# Patient Record
Sex: Male | Born: 1985 | Race: White | Hispanic: No | Marital: Single | Smoking: Current some day smoker
Health system: Southern US, Community
[De-identification: ages and names within clinical notes are randomized; demographics above are authoritative.]

## PROBLEM LIST (undated history)

## (undated) DIAGNOSIS — J069 Acute upper respiratory infection, unspecified: Secondary | ICD-10-CM

## (undated) DIAGNOSIS — Z87442 Personal history of urinary calculi: Secondary | ICD-10-CM

## (undated) DIAGNOSIS — J189 Pneumonia, unspecified organism: Secondary | ICD-10-CM

## (undated) DIAGNOSIS — J939 Pneumothorax, unspecified: Secondary | ICD-10-CM

## (undated) HISTORY — PX: CHEST TUBE INSERTION: SHX231

## (undated) HISTORY — PX: RHINOPLASTY: SUR1284

---

## 2013-09-20 ENCOUNTER — Emergency Department: Payer: Self-pay | Admitting: Internal Medicine

## 2015-04-28 ENCOUNTER — Emergency Department
Admission: EM | Admit: 2015-04-28 | Discharge: 2015-04-28 | Disposition: A | Discharge status: Home / Self Care | Payer: Self-pay | Attending: Emergency Medicine | Admitting: Emergency Medicine

## 2015-04-28 ENCOUNTER — Emergency Department: Payer: Self-pay

## 2015-04-28 ENCOUNTER — Encounter: Payer: Self-pay | Admitting: Emergency Medicine

## 2015-04-28 DIAGNOSIS — B349 Viral infection, unspecified: Secondary | ICD-10-CM | POA: Insufficient documentation

## 2015-04-28 DIAGNOSIS — R590 Localized enlarged lymph nodes: Secondary | ICD-10-CM | POA: Insufficient documentation

## 2015-04-28 HISTORY — DX: Pneumothorax, unspecified: J93.9

## 2015-04-28 HISTORY — DX: Acute upper respiratory infection, unspecified: J06.9

## 2015-04-28 HISTORY — DX: Pneumonia, unspecified organism: J18.9

## 2015-04-28 MED ORDER — OXYMETAZOLINE HCL 0.05 % NA SOLN
1.0000 | Freq: Once | NASAL | Status: AC
  Administered 2015-04-28: 1 via NASAL

## 2015-04-28 MED ORDER — AZITHROMYCIN 250 MG PO TABS: ORAL_TABLET | ORAL | Status: DC

## 2015-04-28 MED ORDER — PREDNISONE 10 MG PO TABS: 10.0000 mg | ORAL_TABLET | Freq: Every day | ORAL | Status: DC

## 2015-04-28 MED ORDER — OXYMETAZOLINE HCL 0.05 % NA SOLN
NASAL | Status: DC
Start: 2015-04-28 — End: 2015-04-29
  Filled 2015-04-28: qty 15

## 2015-04-28 NOTE — ED Notes (Signed)
Pt arrived to the ED for complaints of fever, cough, congestion and headache for the last 4 days. Pt reports taking over the counter medication without relive and states that he has a hx of bad URI. Pt is AOx4 in no apparent distress.

## 2015-04-28 NOTE — Discharge Instructions (Signed)
Viral Infections A viral infection can be caused by different types of viruses.Most viral infections are not serious and resolve on their own. However, some infections may cause severe symptoms and may lead to further complications. SYMPTOMS Viruses can frequently cause:  Minor sore throat.  Aches and pains.  Headaches.  Runny nose.  Different types of rashes.  Watery eyes.  Tiredness.  Cough.  Loss of appetite.  Gastrointestinal infections, resulting in nausea, vomiting, and diarrhea. These symptoms do not respond to antibiotics because the infection is not caused by bacteria. However, you might catch a bacterial infection following the viral infection. This is sometimes called a "superinfection." Symptoms of such a bacterial infection may include:  Worsening sore throat with pus and difficulty swallowing.  Swollen neck glands.  Chills and a high or persistent fever.  Severe headache.  Tenderness over the sinuses.  Persistent overall ill feeling (malaise), muscle aches, and tiredness (fatigue).  Persistent cough.  Yellow, green, or brown mucus production with coughing. HOME CARE INSTRUCTIONS   Only take over-the-counter or prescription medicines for pain, discomfort, diarrhea, or fever as directed by your caregiver.  Drink enough water and fluids to keep your urine clear or pale yellow. Sports drinks can provide valuable electrolytes, sugars, and hydration.  Get plenty of rest and maintain proper nutrition. Soups and broths with crackers or rice are fine. SEEK IMMEDIATE MEDICAL CARE IF:   You have severe headaches, shortness of breath, chest pain, neck pain, or an unusual rash.  You have uncontrolled vomiting, diarrhea, or you are unable to keep down fluids.  You or your child has an oral temperature above 102 F (38.9 C), not controlled by medicine.  Your baby is older than 3 months with a rectal temperature of 102 F (38.9 C) or higher.  Your baby is 853  months old or younger with a rectal temperature of 100.4 F (38 C) or higher. MAKE SURE YOU:   Understand these instructions.  Will watch your condition.  Will get help right away if you are not doing well or get worse.   This information is not intended to replace advice given to you by your health care provider. Make sure you discuss any questions you have with your health care provider.   Document Released: 02/02/2005 Document Revised: 07/18/2011 Document Reviewed: 10/01/2014 Elsevier Interactive Patient Education 2016 Elsevier Inc.  Antibiotic Resistance Antibiotics are medicines used to treat infections caused by bacteria. Antibiotic resistance means the medicine no longer works against the bacteria. If this happens, the bacteria can continue to grow and cause infection. CAUSES  The most common cause of antibiotic resistance is the repeated use of antibiotic medicines. This is especially true when the medicine is not necessary. Antibiotics only work against bacterial infections. When antibiotics are given in response to illnesses caused by viruses, like colds or the flu, many normal bacteria in the body are killed. Some bacteria that are not killed may develop resistance to the antibiotic. These bacteria may grow and cause infections that are resistant to some antibiotics. Other causes of antibiotic resistance may include:  Food sources exposed to antibiotics, such as:  Meat.  Produce grown near livestock treated with antibiotics.  Close contact with someone who has an antibiotic-resistant infection. RISK FACTORS You may be at higher risk for antibiotic resistance if:  You are repeatedly given antibiotics to treat viral infections.  You do not take your medicine as prescribed, such as not finishing all of the medicine.  You need to  take antibiotics often because of a long-term medical condition.  You take medicines that weaken your immune system.  You have  surgery.  You are elderly.  You need dialysis.  You have an organ transplant.  You are being treated for cancer.  You have a type of infection that is more likely to be caused by resistant bacteria. These include certain:  Skin infections.  Sexually transmitted diseases.  Respiratory infections.  Infections of the lining of the brain and spinal cord (meningitis).  You consume foods from animals treated with antibiotics. Antibiotic-resistant bacteria can be passed through the food.  You live with or care for someone with an antibiotic-resistant infection. SIGNS AND SYMPTOMS The main sign of antibiotic resistance is having an infection that does not improve with treatment. The specific signs and symptoms you have will depend on the type of infection present. DIAGNOSIS  Your health care provider may suspect antibiotic resistance if your condition does not improve after you have been treated for an infection. You may have tests done, including:  Collection of a fluid sample. This is done to identify the bacteria under a microscope and determine what type of antibiotic will work against it (culture and sensitivity).  Other blood tests and imaging tests. These are done to check if your infection has spread or has become more serious. TREATMENT  Treatment for antibiotic resistance depends on whether you have an active infection and how severe the infection is. If you have an active infection:  Your health care provider may change your medicine to an antibiotic that kills more types of bacteria (broad spectrum).  Serious antibiotic-resistant infections may need to be treated in the hospital. In some cases, you may need to have the infection drained surgically. You may also need to take medicines through an IV tube. HOME CARE INSTRUCTIONS  Take medicines only as directed by your health care provider.  Take your antibiotic medicine as directed by your health care provider. Finish the  antibiotic even if you start to feel better. Make sure you take the correct dose at the scheduled time.  Do not save any of the antibiotics for the next time you get sick.  Do not take an antibiotic that is prescribed for someone else.  Do not take an antibiotic for a viral infection.  Wash your hands often with soap and water.  Keep your vaccinations current, as directed by your health care provider. SEEK MEDICAL CARE IF:  You have a fever or chills.  You are taking a new antibiotic and you are not getting better after a few days.  You develop new symptoms of infection.  You have three or more periods of diarrhea after starting a new antibiotic.  You think you are having a reaction to the antibiotic medicine, such as developing a rash. SEEK IMMEDIATE MEDICAL CARE IF:  You develop a rash, and you also have:  Itching of your tongue or mouth.  A tight feeling in your throat.  Difficulty breathing.  Chest pain or tightness.  Dizziness or fainting.   This information is not intended to replace advice given to you by your health care provider. Make sure you discuss any questions you have with your health care provider.   Document Released: 07/16/2002 Document Revised: 05/16/2014 Document Reviewed: 01/22/2014 Elsevier Interactive Patient Education [employer]

## 2015-04-28 NOTE — ED Provider Notes (Signed)
CSN: [id number]     Arrival date & time 04/28/15  1939 History   First MD Initiated Contact with Patient 04/28/15 2050     Chief Complaint  Patient presents with  . Nasal Congestion  . Cough  . Headache  . Fever     (Consider location/radiation/quality/duration/timing/severity/associated sxs/prior Treatment) HPI  29 year old male presents to the emergency department for evaluation of sinus pain and pressure congestion and cough. Symptoms been present for 4 days. He has had mild low-grade fevers off and on control with Tylenol. He describes a lot of nasal congestion and pressure in his ears. He denies any nausea vomiting diarrhea, chest pain, shortness of breath, abdominal pain.  Past Medical History  Diagnosis Date  . Pneumothorax   . URI (upper respiratory infection)   . Pneumonia    Past Surgical History  Procedure Laterality Date  . Chest tube insertion    . Rhinoplasty     History reviewed. No pertinent family history. Social History  Substance Use Topics  . Smoking status: Never Smoker   . Smokeless tobacco: None  . Alcohol Use: No    Review of Systems  Constitutional: Positive for fever. Negative for chills, activity change and appetite change.  HENT: Positive for congestion, ear pain, postnasal drip and sinus pressure. Negative for mouth sores, rhinorrhea, sore throat and trouble swallowing.   Eyes: Negative for photophobia, pain and discharge.  Respiratory: Negative for cough, chest tightness and shortness of breath.   Cardiovascular: Negative for chest pain and leg swelling.  Gastrointestinal: Negative for nausea, vomiting, abdominal pain, diarrhea and abdominal distention.  Genitourinary: Negative for dysuria and difficulty urinating.  Musculoskeletal: Negative for back pain, arthralgias and gait problem.  Skin: Negative for color change and rash.  Neurological: Negative for dizziness and headaches.  Hematological: Negative for adenopathy.   Psychiatric/Behavioral: Negative for behavioral problems and agitation.      Allergies  Review of patient's allergies indicates no known allergies.  Home Medications   Prior to Admission medications   Medication Sig Start Date End Date Taking? Authorizing Provider  azithromycin (ZITHROMAX Z-PAK) 250 MG tablet Take 2 tablets (500 mg) on  Day 1,  followed by 1 tablet (250 mg) once daily on Days 2 through 5. 04/28/15   Evon Slackhomas C Debi Cousin, PA-C  predniSONE (DELTASONE) 10 MG tablet Take 1 tablet (10 mg total) by mouth daily. 6,5,4,3,2,1 six day taper 04/28/15   Evon Slackhomas C Daymon Hora, PA-C   BP 151/89 mmHg  Pulse 95  Temp(Src) 98.1 F (36.7 C) (Oral)  Resp 18  Ht 5\' 9"  (1.753 m)  Wt 59.421 kg  BMI 19.34 kg/m2  SpO2 97% Physical Exam  Constitutional: He is oriented to person, place, and time. He appears well-developed and well-nourished.  HENT:  Head: Normocephalic and atraumatic.  Right Ear: External ear normal.  Left Ear: External ear normal.  Nose: Nose normal.  Mouth/Throat: Oropharynx is clear and moist. No oropharyngeal exudate.  Mild mucosal edema. No sinus tenderness to palpation percussion  Eyes: Conjunctivae and EOM are normal. Pupils are equal, round, and reactive to light.  Neck: Normal range of motion. Neck supple.  Cardiovascular: Normal rate, regular rhythm, normal heart sounds and intact distal pulses.   Pulmonary/Chest: Effort normal and breath sounds normal. No respiratory distress. He has no wheezes. He has no rales. He exhibits no tenderness.  Abdominal: Soft. Bowel sounds are normal. He exhibits no distension and no mass. There is no tenderness. There is no rebound and no guarding.  Musculoskeletal: Normal range of motion. He exhibits no edema or tenderness.  Lymphadenopathy:    He has cervical adenopathy (positive posterior cervical).  Neurological: He is alert and oriented to person, place, and time.  Skin: Skin is warm and dry.  Psychiatric: He has a normal mood  and affect. His behavior is normal. Judgment and thought content normal.    ED Course  Procedures (including critical care time) Labs Review Labs Reviewed - No data to display  Imaging Review Dg Chest 2 View  04/28/2015  CLINICAL DATA:  Fever and cough for 4 days EXAM: CHEST - 2 VIEW COMPARISON:  None. FINDINGS: The heart size and mediastinal contours are within normal limits. Both lungs are clear. The visualized skeletal structures are unremarkable. IMPRESSION: No active disease. Electronically Signed   By: Alcide Clever M.D.   On: 04/28/2015 20:33   I have personally reviewed and evaluated these images and lab results as part of my medical decision-making.   EKG Interpretation None      MDM   Final diagnoses:  Viral illness    29 year old male with viral illness. He has significant sinus nasal congestion. He is given Afrin nasal spray, this is giving him significant relief. He will also continue with over-the-counter cough and cold medicine with decongestant. Tylenol and ibuprofen as needed for fevers. He is given postdated prescription for azithromycin if no improvement in 6 days he can start antibiotic. Return to the ER sooner for any worsening symptoms urgent changes in his health.  Evon Slack, PA-C 04/28/15 2118  Jennye Moccasin, MD 04/28/15 [phone]

## 2015-04-28 NOTE — ED Notes (Signed)

## 2015-04-28 NOTE — ED Notes (Signed)
Pt ambulated to treatment room with steady gait. Pt present to ED with c/o productive cough and fever since 04/25/15, states had a fever of 100.5 about 45 min PTA. Also c/o nasal congestion and ear pain x 1 day. Pt reports taking Dayquil and Tylenol without relief. Pt states "I feel like I have fluid in my ear." Pt denies chest pain or shortness of breath. Pt alert and oriented x 4, no increased work in breathing noted. Pt is an everyday smoker.

## 2017-12-30 ENCOUNTER — Emergency Department
Admission: EM | Admit: 2017-12-30 | Discharge: 2017-12-30 | Disposition: A | Discharge status: Home / Self Care | Payer: Self-pay | Attending: Emergency Medicine | Admitting: Emergency Medicine

## 2017-12-30 ENCOUNTER — Other Ambulatory Visit: Payer: Self-pay

## 2017-12-30 ENCOUNTER — Emergency Department: Payer: Self-pay

## 2017-12-30 DIAGNOSIS — R001 Bradycardia, unspecified: Secondary | ICD-10-CM | POA: Insufficient documentation

## 2017-12-30 DIAGNOSIS — Z79899 Other long term (current) drug therapy: Secondary | ICD-10-CM | POA: Insufficient documentation

## 2017-12-30 DIAGNOSIS — N200 Calculus of kidney: Secondary | ICD-10-CM | POA: Insufficient documentation

## 2017-12-30 LAB — COMPREHENSIVE METABOLIC PANEL
ALK PHOS: 55 U/L (ref 38–126)
ALT: 19 U/L (ref 0–44)
AST: 28 U/L (ref 15–41)
Albumin: 4.3 g/dL (ref 3.5–5.0)
Anion gap: 12 (ref 5–15)
BILIRUBIN TOTAL: 0.8 mg/dL (ref 0.3–1.2)
BUN: 27 mg/dL — AB (ref 6–20)
CALCIUM: 9.3 mg/dL (ref 8.9–10.3)
CO2: 21 mmol/L — ABNORMAL LOW (ref 22–32)
CREATININE: 1.05 mg/dL (ref 0.61–1.24)
Chloride: 107 mmol/L (ref 98–111)
GFR calc Af Amer: >60 mL/min (ref >60)
GFR calc non Af Amer: >60 mL/min (ref >60)
Glucose, Bld: 137 mg/dL — ABNORMAL HIGH (ref 70–99)
POTASSIUM: 3.7 mmol/L (ref 3.5–5.1)
Sodium: 140 mmol/L (ref 135–145)
TOTAL PROTEIN: 7.3 g/dL (ref 6.5–8.1)

## 2017-12-30 LAB — URINALYSIS, COMPLETE (UACMP) WITH MICROSCOPIC
Bacteria, UA: NONE SEEN
Bilirubin Urine: NEGATIVE
Glucose, UA: NEGATIVE mg/dL
Hgb urine dipstick: NEGATIVE
Ketones, ur: 20 mg/dL — AB
Leukocytes, UA: NEGATIVE
Nitrite: NEGATIVE
PH: 8 (ref 5.0–8.0)
Protein, ur: NEGATIVE mg/dL
SPECIFIC GRAVITY, URINE: 1.016 (ref 1.005–1.030)

## 2017-12-30 LAB — CBC
HEMATOCRIT: 41 % (ref 40.0–52.0)
HEMOGLOBIN: 14.4 g/dL (ref 13.0–18.0)
MCH: 32.2 pg (ref 26.0–34.0)
MCHC: 35 g/dL (ref 32.0–36.0)
MCV: 92.1 fL (ref 80.0–100.0)
Platelets: 145 10*3/uL — ABNORMAL LOW (ref 150–440)
RBC: 4.45 MIL/uL (ref 4.40–5.90)
RDW: 13.5 % (ref 11.5–14.5)
WBC: 15.1 10*3/uL — AB (ref 3.8–10.6)

## 2017-12-30 LAB — LIPASE, BLOOD: LIPASE: 71 U/L — AB (ref 11–51)

## 2017-12-30 MED ORDER — MORPHINE SULFATE (PF) 4 MG/ML IV SOLN
4.0000 mg | Freq: Once | INTRAVENOUS | Status: AC
  Administered 2017-12-30: 4 mg via INTRAVENOUS

## 2017-12-30 MED ORDER — SODIUM CHLORIDE 0.9 % IV BOLUS
1000.0000 mL | Freq: Once | INTRAVENOUS | Status: AC
  Administered 2017-12-30: 1000 mL via INTRAVENOUS

## 2017-12-30 MED ORDER — HYDROMORPHONE HCL 1 MG/ML IJ SOLN
1.0000 mg | Freq: Once | INTRAMUSCULAR | Status: AC
  Administered 2017-12-30: 1 mg via INTRAVENOUS

## 2017-12-30 MED ORDER — MORPHINE SULFATE (PF) 4 MG/ML IV SOLN
4.0000 mg | Freq: Once | INTRAVENOUS | Status: AC
  Administered 2017-12-30: 4 mg via INTRAVENOUS
  Filled 2017-12-30: qty 1

## 2017-12-30 MED ORDER — TAMSULOSIN HCL 0.4 MG PO CAPS: 0.4000 mg | ORAL_CAPSULE | Freq: Every day | ORAL | 0 refills | Status: DC

## 2017-12-30 MED ORDER — ONDANSETRON HCL 4 MG/2ML IJ SOLN
4.0000 mg | Freq: Once | INTRAMUSCULAR | Status: AC
  Administered 2017-12-30: 4 mg via INTRAVENOUS

## 2017-12-30 MED ORDER — TRAMADOL HCL 50 MG PO TABS: 50.0000 mg | ORAL_TABLET | Freq: Four times a day (QID) | ORAL | 0 refills | Status: AC | PRN

## 2017-12-30 MED ORDER — ONDANSETRON HCL 4 MG/2ML IJ SOLN
INTRAMUSCULAR | Status: AC
  Filled 2017-12-30: qty 2

## 2017-12-30 MED ORDER — KETOROLAC TROMETHAMINE 30 MG/ML IJ SOLN
INTRAMUSCULAR | Status: AC
  Administered 2017-12-30: 30 mg via INTRAVENOUS
  Filled 2017-12-30: qty 1

## 2017-12-30 MED ORDER — NALOXONE HCL 2 MG/2ML IJ SOSY
1.0000 mg | PREFILLED_SYRINGE | Freq: Once | INTRAMUSCULAR | Status: AC
  Administered 2017-12-30: 1 mg via INTRAVENOUS

## 2017-12-30 MED ORDER — MORPHINE SULFATE (PF) 4 MG/ML IV SOLN
INTRAVENOUS | Status: AC
  Filled 2017-12-30: qty 1

## 2017-12-30 MED ORDER — NALOXONE HCL 2 MG/2ML IJ SOSY
PREFILLED_SYRINGE | INTRAMUSCULAR | Status: AC
  Administered 2017-12-30: 1 mg via INTRAVENOUS
  Filled 2017-12-30: qty 2

## 2017-12-30 MED ORDER — HYDROMORPHONE HCL 1 MG/ML IJ SOLN
INTRAMUSCULAR | Status: AC
Start: 2017-12-30 — End: 2017-12-30
  Administered 2017-12-30: 1 mg via INTRAVENOUS
  Filled 2017-12-30: qty 1

## 2017-12-30 MED ORDER — KETOROLAC TROMETHAMINE 30 MG/ML IJ SOLN
30.0000 mg | Freq: Once | INTRAMUSCULAR | Status: AC
  Administered 2017-12-30: 30 mg via INTRAVENOUS

## 2017-12-30 NOTE — ED Notes (Signed)
Patient transported to CT 

## 2017-12-30 NOTE — ED Provider Notes (Signed)
-----------------------------------------   9:28 AM on 12/30/2017 -----------------------------------------  Signed out to me with a kidney stone, medically cleared pending urinalysis for discharge, paperwork is done.  Patient has no complaints at this time, feels much better.  It was however noted that he was having asymptomatic bradycardia.  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  Heart rate is at this time 3367, patient has an RSR prime configuration on EKG with normal axis, nonspecific ST changes, he has had sinus arrhythmia noted.  I discussed with Dr. Lady Garyfath, he and I discussed the patient's findings chief complaint, vital signs and labs etc., he feels the patient is safe for close outpatient follow-up and we will arrange that the referral.  Return precautions were given understood.  Patient has no chest pain no shortness of breath etc.   Jeanmarie PlantMcShane, Xia Stohr A, MD 12/30/17 [phone]

## 2017-12-30 NOTE — ED Notes (Signed)
Note from VernalJenna RN at [phone] is error in charting and was charted on the wrong patient.

## 2017-12-30 NOTE — ED Notes (Addendum)
Pt resting quietly in room, heart rate fluctuates from 38-45 while asleep and when awake goes up to 55-60.  Pt continues to be in normal sinus rhythm. Pt reports he feels weak.  Pt did receive 1L NS. Pt states he has pain when he moves around.  Pt states he does not do any physical activity on a regular basis and states he just works a lot.

## 2017-12-30 NOTE — ED Notes (Signed)
Patient awake and alert and oriented   Family at bedside

## 2017-12-30 NOTE — ED Provider Notes (Signed)
[facility] Emergency Department Provider Note    First MD Initiated Contact with Patient 12/30/17 [phone]     (approximate)  I have reviewed the triage vital signs and the nursing notes.   HISTORY  Chief Complaint Flank Pain    HPI Logan Sellers is a 32 y.o. male with below list of previous medical conditions presents to the emergency department with acute onset of 10 out of 10 left flank/groin pain 30 minutes before arrival.  Patient admits to nausea however no vomiting.  Patient also admits to diaphoresis.  Patient denies any previous history of kidney stones.  She denies any dysuria or fever  Past Medical History:  Diagnosis Date  . Pneumonia   . Pneumothorax   . URI (upper respiratory infection)     There are no active problems to display for this patient.   Past Surgical History:  Procedure Laterality Date  . CHEST TUBE INSERTION    . RHINOPLASTY      Prior to Admission medications   Medication Sig Start Date End Date Taking? Authorizing Provider  azithromycin (ZITHROMAX Z-PAK) 250 MG tablet Take 2 tablets (500 mg) on  Day 1,  followed by 1 tablet (250 mg) once daily on Days 2 through 5. 04/28/15   Evon SlackGaines, Thomas C, PA-C  predniSONE (DELTASONE) 10 MG tablet Take 1 tablet (10 mg total) by mouth daily. 6,5,4,3,2,1 six day taper 04/28/15   Evon SlackGaines, Thomas C, PA-C  tamsulosin [facility](FLOMAX) 0.4 MG CAPS capsule Take 1 capsule (0.4 mg total) by mouth daily after breakfast. 12/30/17   Darci CurrentBrown, [place] N, MD  traMADol (ULTRAM) 50 MG tablet Take 1 tablet (50 mg total) by mouth every 6 (six) hours as needed. 12/30/17 12/30/18  Darci CurrentBrown, Bartolo N, MD    Allergies No known drug allergies No family history on file.  Social History Social History   Tobacco Use  . Smoking status: Never Smoker  Substance Use Topics  . Alcohol use: No  . Drug use: No    Review of Systems Constitutional: No fever/chills Eyes: No visual changes. ENT: No sore  throat. Cardiovascular: Denies chest pain. Respiratory: Denies shortness of breath. Gastrointestinal: No abdominal pain.  No nausea, no vomiting.  No diarrhea.  No constipation. Genitourinary: Negative for dysuria. Musculoskeletal: Negative for neck pain.  Negative for back pain. Integumentary: Negative for rash. Neurological: Negative for headaches, focal weakness or numbness.   ____________________________________________   PHYSICAL EXAM:  VITAL SIGNS: ED Triage Vitals  Enc Vitals Group     BP 12/30/17 0415 (!) 162/97     Pulse Rate 12/30/17 0415 (!) 48     Resp 12/30/17 0415 18     Temp 12/30/17 0415 98.3 F (36.8 C)     Temp Source 12/30/17 0415 Oral     SpO2 12/30/17 0415 99 %     Weight 12/30/17 0403 59 kg (130 lb)     Height 12/30/17 0403 1.753 m (5\' 9" )     Head Circumference --      Peak Flow --      Pain Score 12/30/17 0403 10     Pain Loc --      Pain Edu? --      Excl. in GC? --     Constitutional: Alert and oriented.  Apparent distress Eyes: Conjunctivae are normal. Head: Atraumatic. Mouth/Throat: Mucous membranes are moist. Oropharynx non-erythematous. Neck: No stridor.  Cardiovascular: Normal rate, regular rhythm. Good peripheral circulation. Grossly normal heart sounds. Respiratory: Normal respiratory effort.  No retractions. Lungs CTAB. Gastrointestinal: Soft and nontender. No distention.  Musculoskeletal: No lower extremity tenderness nor edema. No gross deformities of extremities. Neurologic:  Normal speech and language. No gross focal neurologic deficits are appreciated.  Skin:  Skin is warm, dry and intact. No rash noted. Psychiatric: Mood and affect are normal. Speech and behavior are normal.  ____________________________________________   LABS (all labs ordered are listed, but only abnormal results are displayed)  Labs Reviewed  CBC - Abnormal; Notable for the following components:      Result Value   WBC 15.1 (*)    Platelets 145 (*)     All other components within normal limits  COMPREHENSIVE METABOLIC PANEL - Abnormal; Notable for the following components:   CO2 21 (*)    Glucose, Bld 137 (*)    BUN 27 (*)    All other components within normal limits  LIPASE, BLOOD - Abnormal; Notable for the following components:   Lipase 71 (*)    All other components within normal limits  URINALYSIS, COMPLETE (UACMP) WITH MICROSCOPIC   ________  RADIOLOGY I, [place] Mack[place]y Bolte, personally viewed and evaluated these images (plain radiographs) as part of my medical decision making, as well as reviewing the written report by the radiologist.  ED MD interpretation: 2 mm left UVJ stone  Official radiology report(s): Ct Renal Stone Study  Result Date: 12/30/2017 CLINICAL DATA:  LEFT flank pain.  Suspect kidney stones. EXAM: CT ABDOMEN AND PELVIS WITHOUT CONTRAST TECHNIQUE: Multidetector CT imaging of the abdomen and pelvis was performed following the standard protocol without IV contrast. COMPARISON:  None. FINDINGS: LOWER CHEST: Lung bases are clear. The visualized heart size is normal. No pericardial effusion. HEPATOBILIARY: Normal. PANCREAS: Normal. SPLEEN: Normal. ADRENALS/URINARY TRACT: Kidneys are orthotopic, demonstrating normal size and morphology. Mild LEFT hydroureteronephrosis to the level of the ureterovesicular junction where a 2 mm calculus is present. Punctate bilateral and 3 mm LEFT interpolar nephrolithiasis. No RIGHT nephrolithiasis or hydronephrosis limited assessment for renal masses by nonenhanced CT. Urinary bladder is partially distended and unremarkable. Normal adrenal glands. STOMACH/BOWEL: The stomach, small and large bowel are normal in course and caliber without inflammatory changes, sensitivity decreased by lack of enteric contrast. Normal appendix. VASCULAR/LYMPHATIC: Aortoiliac vessels are normal in course and caliber. No lymphadenopathy by CT size criteria. REPRODUCTIVE: Normal. OTHER: No intraperitoneal free  fluid or free air. Phleboliths RIGHT pelvis. MUSCULOSKELETAL: 11 x 7 mm soft tissue ventral epidural space at L5-S1. Mild broad dextroscoliosis. IMPRESSION: 1. 2 mm LEFT ureterovesicular junction calculus with mild residual hydroureteronephrosis. 2. Bilateral nephrolithiasis measuring to 3 mm. 3. 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 Non emergent MRI lumbar spine with better characterize finding. Electronically Signed   By: Awilda Metro M.D.   On: 12/30/2017 05:10     Procedures   ____________________________________________   INITIAL IMPRESSION / ASSESSMENT AND PLAN / ED COURSE  As part of my medical decision making, I reviewed the following data within the electronic MEDICAL RECORD NUMBER   32 year old male presenting to the emergency room with above-stated history and physical exam consistent with left Ureterolithiasis confirmed on CT Renal.  Patient given IV fluids 1 L IV morphine 4 mg however patient states that pain was not resolved and as such patient given IV Dilaudid 1 mg.  Patient also given IV Toradol 30mg .  CT scan revealed a 3 mm right UVJ stone patient patient's pain improved.  Patient did however have an episode of hypoxia following Dilaudid administration such 1 mg of was  administered with resolution of hypoxia. ____________________________________________  FINAL CLINICAL IMPRESSION(S) / ED DIAGNOSES  Final diagnoses:  Kidney stone on left side     MEDICATIONS GIVEN DURING THIS VISIT:  Medications  morphine 4 MG/ML injection 4 mg (4 mg Intravenous Given 12/30/17 0419)  ondansetron (ZOFRAN) injection 4 mg (4 mg Intravenous Given 12/30/17 0419)  sodium chloride 0.9 % bolus 1,000 mL (0 mLs Intravenous Stopped 12/30/17 0605)  morphine 4 MG/ML injection 4 mg (4 mg Intravenous Given 12/30/17 0434)  HYDROmorphone (DILAUDID) injection 1 mg (1 mg Intravenous Given 12/30/17 0500)  ketorolac (TORADOL) 30 MG/ML injection 30 mg (30 mg Intravenous Given 12/30/17 0459)  naloxone (NARCAN)  injection 1 mg (1 mg Intravenous Given 12/30/17 0525)     ED Discharge Orders         Ordered    traMADol (ULTRAM) 50 MG tablet  Every 6 hours PRN     12/30/17 0555    tamsulosin (FLOMAX) 0.4 MG CAPS capsule  Daily after breakfast     12/30/17 0555           Note:  This document was prepared using Dragon voice recognition software and may include unintentional dictation errors.    Darci Current, MD 12/30/17 [phone]

## 2017-12-30 NOTE — ED Notes (Signed)
Patient's heart rated dropped to 39. Oxygen level decreased to 88% in RA. MD informed. MD and RN to bedside.

## 2017-12-30 NOTE — ED Notes (Signed)
Stood up and voided 100 ml

## 2017-12-30 NOTE — ED Triage Notes (Signed)
Pt in with co left flank pain acute onset, pt diaphoretic in triage.

## 2017-12-30 NOTE — ED Notes (Signed)
This RN gave report to Consulting civil engineerCharge RN, [facility]

## 2019-06-15 ENCOUNTER — Encounter: Payer: Self-pay | Admitting: Emergency Medicine

## 2019-06-15 ENCOUNTER — Emergency Department
Admission: EM | Admit: 2019-06-15 | Discharge: 2019-06-16 | Disposition: A | Discharge status: Home / Self Care | Payer: Self-pay | Attending: Emergency Medicine | Admitting: Emergency Medicine

## 2019-06-15 ENCOUNTER — Emergency Department: Payer: Self-pay

## 2019-06-15 DIAGNOSIS — N452 Orchitis: Secondary | ICD-10-CM | POA: Insufficient documentation

## 2019-06-15 DIAGNOSIS — N50812 Left testicular pain: Secondary | ICD-10-CM

## 2019-06-15 DIAGNOSIS — Z79899 Other long term (current) drug therapy: Secondary | ICD-10-CM | POA: Insufficient documentation

## 2019-06-15 DIAGNOSIS — N50819 Testicular pain, unspecified: Secondary | ICD-10-CM

## 2019-06-15 DIAGNOSIS — R109 Unspecified abdominal pain: Secondary | ICD-10-CM | POA: Insufficient documentation

## 2019-06-15 DIAGNOSIS — N5089 Other specified disorders of the male genital organs: Secondary | ICD-10-CM

## 2019-06-15 HISTORY — DX: Personal history of urinary calculi: Z87.442

## 2019-06-15 LAB — BASIC METABOLIC PANEL
Anion gap: 10 (ref 5–15)
BUN: 22 mg/dL — ABNORMAL HIGH (ref 6–20)
CO2: 27 mmol/L (ref 22–32)
Calcium: 9.5 mg/dL (ref 8.9–10.3)
Chloride: 102 mmol/L (ref 98–111)
Creatinine, Ser: 1.07 mg/dL (ref 0.61–1.24)
GFR calc Af Amer: >60 mL/min (ref >60)
GFR calc non Af Amer: >60 mL/min (ref >60)
Glucose, Bld: 111 mg/dL — ABNORMAL HIGH (ref 70–99)
Potassium: 3.4 mmol/L — ABNORMAL LOW (ref 3.5–5.1)
Sodium: 139 mmol/L (ref 135–145)

## 2019-06-15 LAB — URINALYSIS, ROUTINE W REFLEX MICROSCOPIC
Bacteria, UA: NONE SEEN
Bilirubin Urine: NEGATIVE
Glucose, UA: NEGATIVE mg/dL
Hgb urine dipstick: NEGATIVE
Ketones, ur: NEGATIVE mg/dL
Nitrite: NEGATIVE
Protein, ur: NEGATIVE mg/dL
Specific Gravity, Urine: 1.014 (ref 1.005–1.030)
pH: 7 (ref 5.0–8.0)

## 2019-06-15 LAB — CBC
HCT: 40.1 % (ref 39.0–52.0)
Hemoglobin: 14.2 g/dL (ref 13.0–17.0)
MCH: 31.7 pg (ref 26.0–34.0)
MCHC: 35.4 g/dL (ref 30.0–36.0)
MCV: 89.5 fL (ref 80.0–100.0)
Platelets: 141 10*3/uL — ABNORMAL LOW (ref 150–400)
RBC: 4.48 MIL/uL (ref 4.22–5.81)
RDW: 12.5 % (ref 11.5–15.5)
WBC: 10.7 10*3/uL — ABNORMAL HIGH (ref 4.0–10.5)
nRBC: 0 % (ref 0.0–0.2)

## 2019-06-15 MED ORDER — OXYCODONE-ACETAMINOPHEN 5-325 MG PO TABS
1.0000 | ORAL_TABLET | Freq: Once | ORAL | Status: AC
  Administered 2019-06-15: 19:00:00 1 via ORAL
  Filled 2019-06-15: qty 1

## 2019-06-15 NOTE — ED Triage Notes (Signed)
Pt to ED with c/o of left testicle and left flank pain that started yesterday. Pt states hx of kidney stones.

## 2019-06-15 NOTE — ED Provider Notes (Signed)
[facility] Emergency Department Provider Note  ____________________________________________   First MD Initiated Contact with Patient 06/15/19 2259     (approximate)  I have reviewed the triage vital signs and the nursing notes.   HISTORY  Chief Complaint Abdominal Pain    HPI Logan Sellers is a 34 y.o. male whose history includes a prior episode of kidney stones about a year and a half ago.  He presents tonight for acute onset sharp pain in his left testicle but also in his left flank.  He said initially it felt like it started in the left testicle and was rating up into the left flank and then it seemed to change locations.  When he arrived in the ED the pain was severe and he could barely move or walk.  However it got a lot better after the Percocet he was given here.  He still has some residual aching pain in the left testicle and he says he feels like it is a little bit swollen at the top of the testicle.  He has had no injury recently and the most severe pain he had was actually in the left flank rather than in the testicle but he is more worried about the testicle pain.  He states that the pain he was experiencing in the left flank feels similar to that which he experienced a year or 2 ago when he had a kidney stone.  He has seen no blood in his urine.  He has no concerns for sexually transmitted disease.  He has had nausea but no vomiting.  He denies fever/chills, sore throat, chest pain, cough, shortness of breath, and any other abdominal pain.   He has not had any dysuria but reports increased urinary frequency with hesitancy.        Past Medical History:  Diagnosis Date  . History of kidney stones   . Pneumonia   . Pneumothorax   . URI (upper respiratory infection)     There are no problems to display for this patient.   Past Surgical History:  Procedure Laterality Date  . CHEST TUBE INSERTION    . RHINOPLASTY      Prior to Admission  medications   Medication Sig Start Date End Date Taking? Authorizing Provider  azithromycin (ZITHROMAX Z-PAK) 250 MG tablet Take 2 tablets (500 mg) on  Day 1,  followed by 1 tablet (250 mg) once daily on Days 2 through 5. 04/28/15   Evon Slack, PA-C  doxycycline (VIBRAMYCIN) 100 MG capsule Take 1 capsule (100 mg total) by mouth 2 (two) times daily for 14 days. 06/16/19 06/30/19  Loleta Rose, MD  HYDROcodone-acetaminophen (NORCO/VICODIN) 5-325 MG tablet Take 2 tablets by mouth every 6 (six) hours as needed for moderate pain or severe pain. 06/16/19   Loleta Rose, MD  ondansetron (ZOFRAN ODT) 4 MG disintegrating tablet Allow 1-2 tablets to dissolve in your mouth every 8 hours as needed for nausea/vomiting 06/16/19   Loleta Rose, MD  predniSONE (DELTASONE) 10 MG tablet Take 1 tablet (10 mg total) by mouth daily. 6,5,4,3,2,1 six day taper 04/28/15   Evon Slack, PA-C  tamsulosin [facility]) 0.4 MG CAPS capsule Take 1 capsule (0.4 mg total) by mouth daily after breakfast. 12/30/17   Darci Current, MD    Allergies Patient has no known allergies.  History reviewed. No pertinent family history.  Social History Social History   Tobacco Use  . Smoking status: Never Smoker  . Smokeless tobacco:  Never Used  Substance Use Topics  . Alcohol use: No  . Drug use: No    Review of Systems Constitutional: No fever/chills Eyes: No visual changes. ENT: No sore throat. Cardiovascular: Denies chest pain. Respiratory: Denies shortness of breath. Gastrointestinal: Nausea but no vomiting.  Some pain that radiates from his left flank down into his left testicle or vice versa. Genitourinary: Left testicular pain and possible swelling.  No hematuria nor dysuria.  Increased urinary frequency and hesitancy. Musculoskeletal: Left flank pain.  Negative for neck pain.  Negative for back pain. Integumentary: Negative for rash. Neurological: Negative for headaches, focal weakness or  numbness.   ____________________________________________   PHYSICAL EXAM:  VITAL SIGNS: ED Triage Vitals [06/15/19 1837]  Enc Vitals Group     BP (!) 155/96     Pulse Rate 65     Resp 20     Temp 97.6 F (36.4 C)     Temp Source Oral     SpO2 100 %     Weight      Height      Head Circumference      Peak Flow      Pain Score 9     Pain Loc      Pain Edu?      Excl. in [place]?     Constitutional: Alert and oriented.  At this time he is well-appearing and in no acute distress. Eyes: Conjunctivae are normal.  Head: Atraumatic. Nose: No congestion/rhinnorhea. Mouth/Throat: Patient is wearing a mask. Neck: No stridor.  No meningeal signs.   Cardiovascular: Normal rate, regular rhythm. Good peripheral circulation. Grossly normal heart sounds. Respiratory: Normal respiratory effort.  No retractions. Gastrointestinal: Soft and nontender. No distention.  GU: The patient has some tenderness to palpation of the left epididymis, possibly some swelling.  There is no scrotal edema and no notable tenderness to palpation or manipulation of the testes. Musculoskeletal: No CVA tenderness to percussion.  No lower extremity tenderness nor edema. No gross deformities of extremities. Neurologic:  Normal speech and language. No gross focal neurologic deficits are appreciated.  Skin:  Skin is warm, dry and intact. Psychiatric: Mood and affect are normal. Speech and behavior are normal.  ____________________________________________   LABS (all labs ordered are listed, but only abnormal results are displayed)  Labs Reviewed  BASIC METABOLIC PANEL - Abnormal; Notable for the following components:      Result Value   Potassium 3.4 (*)    Glucose, Bld 111 (*)    BUN 22 (*)    All other components within normal limits  CBC - Abnormal; Notable for the following components:   WBC 10.7 (*)    Platelets 141 (*)    All other components within normal limits  URINALYSIS, ROUTINE W REFLEX  MICROSCOPIC - Abnormal; Notable for the following components:   Color, Urine YELLOW (*)    APPearance CLOUDY (*)    Leukocytes,Ua TRACE (*)    All other components within normal limits   ____________________________________________  EKG  None - EKG not ordered by ED physician ____________________________________________  RADIOLOGY Ursula Alert, personally viewed and evaluated these images (plain radiographs) as part of my medical decision making, as well as reviewing the written report by the radiologist.  ED MD interpretation:  Nothing on x-ray, probable orchitis on left testicle  Official radiology report(s): DG Abdomen 1 View  Result Date: 06/15/2019 CLINICAL DATA:  Left testicular pain and swelling EXAM: ABDOMEN - 1 VIEW COMPARISON:  None. FINDINGS:  The bowel gas pattern is normal. No radio-opaque calculi or other significant radiographic abnormality are seen. IMPRESSION: Negative. Electronically Signed   By: Jonna Clark M.D.   On: 06/15/2019 23:39   US SCROTUM W/DOPPLER  Result Date: 06/16/2019 CLINICAL DATA:  Left testicular pain and swelling since January. EXAM: SCROTAL ULTRASOUND DOPPLER ULTRASOUND OF THE TESTICLES TECHNIQUE: Complete ultrasound examination of the testicles, epididymis, and other scrotal structures was performed. Color and spectral Doppler ultrasound were also utilized to evaluate blood flow to the testicles. COMPARISON:  None. FINDINGS: Right testicle Measurements: 3.8 x 2.2 x 2.9 cm. No mass or microlithiasis visualized. Left testicle Measurements: 3.9 x 2.4 x 2.9 cm. There is no mass. A few calcifications are noted without definite sonographic evidence for microlithiasis. There is slightly increased vascularity within the left testicle when compared to the right. Right epididymis:  Normal in size and appearance. Left epididymis:  There is a borderline left-sided varicocele. Hydrocele:  None visualized. Varicocele:  None visualized. Pulsed Doppler interrogation  of both testes demonstrates normal low resistance arterial and venous waveforms bilaterally. IMPRESSION: 1. No sonographic evidence for testicular torsion. 2. Slight asymmetric increased vascularity within left testicle may represent left-sided orchitis in the appropriate clinical setting. 3. Borderline left-sided varicocele. Electronically Signed   By: Katherine Mantle M.D.   On: 06/16/2019 00:14    ____________________________________________   PROCEDURES   Procedure(s) performed (including Critical Care):  Procedures   ____________________________________________   INITIAL IMPRESSION / MDM / ASSESSMENT AND PLAN / ED COURSE  As part of my medical decision making, I reviewed the following data within the electronic MEDICAL RECORD NUMBER Nursing notes reviewed and incorporated, Labs reviewed , Old chart reviewed, Radiograph reviewed , Notes from prior ED visits and [place] Controlled Substance Database   Differential diagnosis includes, but is not limited to, renal/ureteral colic, epididymitis/orchitis, testicular torsion, musculoskeletal pain/strain, UTI.  The patient reports that he was in severe pain earlier but his pain is currently well controlled and he has a little bit of residual dull aching in his left testicle.  The presentation is most consistent with ureteral colic and he has a history of kidney stones.  We talked about CT scan versus x-ray and I do not think that a CT scan would be particularly necessary at this time, but I will get a 1 view abdominal x-ray to look for evidence of a clinically significant large stone.  However he is most concerned about the testicular pain and the presentation is not entirely consistent with a kidney stone causing the pain in the testicle.  He has a bit of epididymal swelling and tenderness, and and a scrotal ultrasound with Dopplers would be appropriate to rule out torsion and epididymitis.  Patient agrees with the plan and I will reassess his pain status  after ultrasound and x-ray.      Clinical Course as of Jun 15 134  Wynelle Link Jun 16, 2019  0008 No acute abnormalities identified on 1 view abdominal x-ray.  DG Abdomen 1 View [CF]  0126 Ultrasound does not show any emergent finding but is consistent with orchitis which matches with the clinical presentation.  Given the findings I will treat him with antibiotics (listed below) and encouraged outpatient follow-up with urology.  There is no sign of stone on his plain film x-ray, but I will provide him with a short course of Norco given the severe pain with which he arrived and no concerning findings on the [place] controlled substance database, as well  as some Zofran.  I gave my usual customary return precautions.  US SCROTUM W/DOPPLER [CF]    Clinical Course User Index [CF] Loleta Rose, MD     ____________________________________________  FINAL CLINICAL IMPRESSION(S) / ED DIAGNOSES  Final diagnoses:  Testicular discomfort  Orchitis of left testicle  Left flank pain     MEDICATIONS GIVEN DURING THIS VISIT:  Medications  cefTRIAXone (ROCEPHIN) injection 250 mg (has no administration in time range)  doxycycline (VIBRA-TABS) tablet 100 mg (has no administration in time range)  oxyCODONE-acetaminophen (PERCOCET/ROXICET) 5-325 MG per tablet 1 tablet (1 tablet Oral Given 06/15/19 1848)     ED Discharge Orders         Ordered    doxycycline (VIBRAMYCIN) 100 MG capsule  2 times daily     06/16/19 0132    HYDROcodone-acetaminophen (NORCO/VICODIN) 5-325 MG tablet  Every 6 hours PRN     06/16/19 0132    ondansetron (ZOFRAN ODT) 4 MG disintegrating tablet     06/16/19 0132          *Please note:  Trennon Torbeck was evaluated in Emergency Department on 06/16/2019 for the symptoms described in the history of present illness. He was evaluated in the context of the global COVID-19 pandemic, which necessitated consideration that the patient might be at risk for infection with the  SARS-CoV-2 virus that causes COVID-19. Institutional protocols and algorithms that pertain to the evaluation of patients at risk for COVID-19 are in a state of rapid change based on information released by regulatory bodies including the CDC and federal and state organizations. These policies and algorithms were followed during the patient's care in the ED.  Some ED evaluations and interventions may be delayed as a result of limited staffing during the pandemic.*  Note:  This document was prepared using Dragon voice recognition software and may include unintentional dictation errors.   Loleta Rose, MD 06/16/19 [phone]

## 2019-06-15 NOTE — ED Notes (Signed)
Pt to US.

## 2019-06-16 ENCOUNTER — Other Ambulatory Visit: Payer: Self-pay

## 2019-06-16 ENCOUNTER — Emergency Department
Admission: EM | Admit: 2019-06-16 | Discharge: 2019-06-16 | Disposition: A | Discharge status: Home / Self Care | Payer: Self-pay | Attending: Emergency Medicine | Admitting: Emergency Medicine

## 2019-06-16 ENCOUNTER — Encounter: Payer: Self-pay | Admitting: Emergency Medicine

## 2019-06-16 ENCOUNTER — Emergency Department: Payer: Self-pay

## 2019-06-16 DIAGNOSIS — N50812 Left testicular pain: Secondary | ICD-10-CM | POA: Insufficient documentation

## 2019-06-16 DIAGNOSIS — N201 Calculus of ureter: Secondary | ICD-10-CM | POA: Insufficient documentation

## 2019-06-16 LAB — CBC WITH DIFFERENTIAL/PLATELET
Abs Immature Granulocytes: 0.05 10*3/uL (ref 0.00–0.07)
Basophils Absolute: 0.1 10*3/uL (ref 0.0–0.1)
Basophils Relative: 0 %
Eosinophils Absolute: 0.1 10*3/uL (ref 0.0–0.5)
Eosinophils Relative: 0 %
HCT: 42.4 % (ref 39.0–52.0)
Hemoglobin: 15 g/dL (ref 13.0–17.0)
Immature Granulocytes: 0 %
Lymphocytes Relative: 9 %
Lymphs Abs: 1.6 10*3/uL (ref 0.7–4.0)
MCH: 31.4 pg (ref 26.0–34.0)
MCHC: 35.4 g/dL (ref 30.0–36.0)
MCV: 88.9 fL (ref 80.0–100.0)
Monocytes Absolute: 0.9 10*3/uL (ref 0.1–1.0)
Monocytes Relative: 5 %
Neutro Abs: 14.6 10*3/uL — ABNORMAL HIGH (ref 1.7–7.7)
Neutrophils Relative %: 86 %
Platelets: 131 10*3/uL — ABNORMAL LOW (ref 150–400)
RBC: 4.77 MIL/uL (ref 4.22–5.81)
RDW: 12.4 % (ref 11.5–15.5)
WBC: 17.3 10*3/uL — ABNORMAL HIGH (ref 4.0–10.5)
nRBC: 0 % (ref 0.0–0.2)

## 2019-06-16 LAB — COMPREHENSIVE METABOLIC PANEL
ALT: 18 U/L (ref 0–44)
AST: 32 U/L (ref 15–41)
Albumin: 4.8 g/dL (ref 3.5–5.0)
Alkaline Phosphatase: 51 U/L (ref 38–126)
Anion gap: 11 (ref 5–15)
BUN: 20 mg/dL (ref 6–20)
CO2: 23 mmol/L (ref 22–32)
Calcium: 9.9 mg/dL (ref 8.9–10.3)
Chloride: 103 mmol/L (ref 98–111)
Creatinine, Ser: 1.17 mg/dL (ref 0.61–1.24)
GFR calc Af Amer: >60 mL/min (ref >60)
GFR calc non Af Amer: >60 mL/min (ref >60)
Glucose, Bld: 108 mg/dL — ABNORMAL HIGH (ref 70–99)
Potassium: 4.1 mmol/L (ref 3.5–5.1)
Sodium: 137 mmol/L (ref 135–145)
Total Bilirubin: 1.1 mg/dL (ref 0.3–1.2)
Total Protein: 7.5 g/dL (ref 6.5–8.1)

## 2019-06-16 LAB — LIPASE, BLOOD: Lipase: 21 U/L (ref 11–51)

## 2019-06-16 MED ORDER — KETOROLAC TROMETHAMINE 30 MG/ML IJ SOLN
30.0000 mg | Freq: Once | INTRAMUSCULAR | Status: AC
  Administered 2019-06-16: 30 mg via INTRAVENOUS
  Filled 2019-06-16: qty 1

## 2019-06-16 MED ORDER — HYDROCODONE-ACETAMINOPHEN 5-325 MG PO TABS: 2.0000 | ORAL_TABLET | Freq: Four times a day (QID) | ORAL | 0 refills | Status: DC | PRN

## 2019-06-16 MED ORDER — OXYCODONE HCL 5 MG PO TABS
5.0000 mg | ORAL_TABLET | Freq: Once | ORAL | Status: AC
  Administered 2019-06-16: 10:00:00 5 mg via ORAL
  Filled 2019-06-16: qty 1

## 2019-06-16 MED ORDER — LIDOCAINE HCL (PF) 1 % IJ SOLN: 5.0000 mL | Freq: Once | INTRAMUSCULAR | Status: AC

## 2019-06-16 MED ORDER — CEFTRIAXONE SODIUM 250 MG IJ SOLR
250.0000 mg | Freq: Once | INTRAMUSCULAR | Status: AC
  Administered 2019-06-16: 02:00:00 250 mg via INTRAMUSCULAR
  Filled 2019-06-16: qty 250

## 2019-06-16 MED ORDER — MORPHINE SULFATE (PF) 4 MG/ML IV SOLN
4.0000 mg | Freq: Once | INTRAVENOUS | Status: AC
  Administered 2019-06-16: 4 mg via INTRAVENOUS
  Filled 2019-06-16: qty 1

## 2019-06-16 MED ORDER — LIDOCAINE HCL (PF) 1 % IJ SOLN
INTRAMUSCULAR | Status: AC
  Administered 2019-06-16: 5 mL
  Filled 2019-06-16: qty 5

## 2019-06-16 MED ORDER — DOXYCYCLINE HYCLATE 100 MG PO TABS
100.0000 mg | ORAL_TABLET | Freq: Once | ORAL | Status: AC
  Administered 2019-06-16: 100 mg via ORAL
  Filled 2019-06-16: qty 1

## 2019-06-16 MED ORDER — ONDANSETRON 4 MG PO TBDP: ORAL_TABLET | ORAL | 0 refills | Status: DC

## 2019-06-16 MED ORDER — DOXYCYCLINE HYCLATE 100 MG PO CAPS: 100.0000 mg | ORAL_CAPSULE | Freq: Two times a day (BID) | ORAL | 0 refills | Status: AC

## 2019-06-16 MED ORDER — SODIUM CHLORIDE 0.9 % IV BOLUS
1000.0000 mL | Freq: Once | INTRAVENOUS | Status: AC
  Administered 2019-06-16: 1000 mL via INTRAVENOUS

## 2019-06-16 NOTE — ED Provider Notes (Addendum)
[facility] Emergency Department Provider Note ____________________________________________   First MD Initiated Contact with Patient 06/16/19 1058     (approximate)  I have reviewed the triage vital signs and the nursing notes.   HISTORY  Chief Complaint Flank Pain    HPI Logan Sellers is a 34 y.o. male with PMH as noted below including prior kidney stones who presents with left lower quadrant and flank pain radiating to the left testicle acute onset yesterday evening.  The patient was seen in the ED last night and had an scrotal ultrasound and plain films of the abdomen.  He was diagnosed with possible orchitis and sent home with analgesia.  However, the pain worsened and is not responding to the prescribed medications.  He denies any acute urinary symptoms.  He has no fever.  He states that he did vomit once earlier.  Past Medical History:  Diagnosis Date  . History of kidney stones   . Pneumonia   . Pneumothorax   . URI (upper respiratory infection)     There are no problems to display for this patient.   Past Surgical History:  Procedure Laterality Date  . CHEST TUBE INSERTION    . RHINOPLASTY      Prior to Admission medications   Medication Sig Start Date End Date Taking? Authorizing Provider  azithromycin (ZITHROMAX Z-PAK) 250 MG tablet Take 2 tablets (500 mg) on  Day 1,  followed by 1 tablet (250 mg) once daily on Days 2 through 5. 04/28/15   Duanne Guess, PA-C  doxycycline (VIBRAMYCIN) 100 MG capsule Take 1 capsule (100 mg total) by mouth 2 (two) times daily for 14 days. 06/16/19 06/30/19  Hinda Kehr, MD  HYDROcodone-acetaminophen (NORCO/VICODIN) 5-325 MG tablet Take 2 tablets by mouth every 6 (six) hours as needed for moderate pain or severe pain. 06/16/19   Hinda Kehr, MD  ondansetron (ZOFRAN ODT) 4 MG disintegrating tablet Allow 1-2 tablets to dissolve in your mouth every 8 hours as needed for nausea/vomiting 06/16/19   Hinda Kehr, MD   predniSONE (DELTASONE) 10 MG tablet Take 1 tablet (10 mg total) by mouth daily. 6,5,4,3,2,1 six day taper 04/28/15   Duanne Guess, PA-C  tamsulosin [facility]) 0.4 MG CAPS capsule Take 1 capsule (0.4 mg total) by mouth daily after breakfast. 12/30/17   Gregor Hams, MD    Allergies Patient has no known allergies.  No family history on file.  Social History Social History   Tobacco Use  . Smoking status: Never Smoker  . Smokeless tobacco: Never Used  Substance Use Topics  . Alcohol use: No  . Drug use: No    Review of Systems  Constitutional: No fever. Eyes: No redness. ENT: No sore throat. Cardiovascular: Denies chest pain. Respiratory: Denies shortness of breath. Gastrointestinal: Positive for resolved vomiting. Genitourinary: Negative for dysuria.  Positive for flank pain. Musculoskeletal: Negative for back pain. Skin: Negative for rash. Neurological: Negative for headache.   ____________________________________________   PHYSICAL EXAM:  VITAL SIGNS: ED Triage Vitals [06/16/19 1011]  Enc Vitals Group     BP 137/78     Pulse Rate 65     Resp (!) 26     Temp 97.7 F (36.5 C)     Temp Source Oral     SpO2 100 %     Weight 130 lb (59 kg)     Height 5\' 9"  (1.753 m)     Head Circumference      Peak Flow  Pain Score 10     Pain Loc      Pain Edu?      Excl. in [place]?     Constitutional: Alert and oriented.  Uncomfortable appearing but in no acute distress. Eyes: Conjunctivae are normal.  Head: Atraumatic. Nose: No congestion/rhinnorhea. Mouth/Throat: Mucous membranes are moist.   Neck: Normal range of motion.  Cardiovascular: Normal rate, regular rhythm.  Good peripheral circulation. Respiratory: Normal respiratory effort.  No retractions.  Gastrointestinal: Soft and nontender. No distention.  Genitourinary: No CVA tenderness.  Mild left flank tenderness. Musculoskeletal:   Extremities warm and well perfused.  Neurologic:  Normal speech and  language. No gross focal neurologic deficits are appreciated.  Skin:  Skin is warm and dry. No rash noted. Psychiatric: Mood and affect are normal. Speech and behavior are normal.  ____________________________________________   LABS (all labs ordered are listed, but only abnormal results are displayed)  Labs Reviewed  CBC WITH DIFFERENTIAL/PLATELET - Abnormal; Notable for the following components:      Result Value   WBC 17.3 (*)    Platelets 131 (*)    Neutro Abs 14.6 (*)    All other components within normal limits  COMPREHENSIVE METABOLIC PANEL - Abnormal; Notable for the following components:   Glucose, Bld 108 (*)    All other components within normal limits  LIPASE, BLOOD  URINALYSIS, ROUTINE W REFLEX MICROSCOPIC   ____________________________________________  EKG   ____________________________________________  RADIOLOGY  CT abdomen: 3 to 4 mm left UVJ stone  ____________________________________________   PROCEDURES  Procedure(s) performed: No  Procedures  Critical Care performed: No ____________________________________________   INITIAL IMPRESSION / ASSESSMENT AND PLAN / ED COURSE  Pertinent labs & imaging results that were available during my care of the patient were reviewed by me and considered in my medical decision making (see chart for details).  34 year old male with a prior history of kidney stones presents with persistent left lower quadrant and flank pain radiating to left testicle.  The patient was seen in the ED last night.  I reviewed the past medical records in Epic.  Scrotal ultrasound showed slightly increased vascularity in the left testicle, possibly suggestive of orchitis and an abdominal plain film did not show any obvious evidence of a stone.  On exam today, the patient is uncomfortable but not acutely ill-appearing.  His vital signs are normal.  He does not have any significant abdominal tenderness, and states that the pain now is more  in the left lower quadrant and left flank and not so much in the testicle.  He denies any acute testicular swelling.  CT renal stone study was obtained and confirms a 3 to 4 mm stone in the distal left ureter.  This is consistent with the patient's pain.  We will give fluids and additional IV analgesia and reassess.  ----------------------------------------- 1:27 PM on 06/16/2019 -----------------------------------------  Lab work-up reveals elevated WBC count, although the patient has no dysuria, fever, or other signs of pyelonephritis.  This may be more of a stress reaction.  He has not given a urine sample yet, but clinically there is no evidence of infection or indication for antibiotics.  Patient reports resolved pain at this time.  He feels comfortable going home.  I counseled him on the results of the work-up and the plan of care.  I also gave him urology referral.  Return precautions given, and he expresses understanding.  ____________________________________________   FINAL CLINICAL IMPRESSION(S) / ED DIAGNOSES  Final diagnoses:  Ureteral stone      NEW MEDICATIONS STARTED DURING THIS VISIT:  New Prescriptions   No medications on file     Note:  This document was prepared using Dragon voice recognition software and may include unintentional dictation errors.    Dionne Bucy, MD 06/16/19 1327    Dionne Bucy, MD 06/16/19 1328

## 2019-06-16 NOTE — ED Notes (Signed)
ED Provider at bedside. 

## 2019-06-16 NOTE — ED Triage Notes (Signed)
Pt presents to ED via POV with c/o L flank pain, pt D/C earlier today with dx of Orchitis. Pt presented to ED yesterday with same complaints. Pt states hx of kidney stones.

## 2019-06-16 NOTE — Discharge Instructions (Addendum)
You should fill the prescriptions from your ER visit last night and use them as needed.  Return to the ER for new, worsening, or persistent severe pain, vomiting, fever, weakness, or any other new or worsening symptoms that concern you.

## 2019-06-16 NOTE — Discharge Instructions (Signed)
As we discussed, it looks like you might have a mild infection of the left testicle, called orchitis.  Please take the full 14-day course of antibiotics prescribed.  It is also possible, based on the symptoms you described, that you passed a kidney stone.  I prescribed some medicine for you to take as needed (for nausea and severe pain), but I recommend you first try using over-the-counter ibuprofen and Tylenol according to label instructions. Take Norco as prescribed for severe pain. Do not drink alcohol, drive or participate in any other potentially dangerous activities while taking this medication as it may make you sleepy. Do not take this medication with any other sedating medications, either prescription or over-the-counter. If you were prescribed Percocet or Vicodin, do not take these with acetaminophen (Tylenol) as it is already contained within these medications.   This medication is an opiate (or narcotic) pain medication and can be habit forming.  Use it as little as possible to achieve adequate pain control.  Do not use or use it with extreme caution if you have a history of opiate abuse or dependence.  If you are on a pain contract with your primary care doctor or a pain specialist, be sure to let them know you were prescribed this medication today from the [facility] Emergency Department.  This medication is intended for your use only - do not give any to anyone else and keep it in a secure place where nobody else, especially children, have access to it.  It will also cause or worsen constipation, so you may want to consider taking an over-the-counter stool softener while you are taking this medication.  Follow-up with Dr. Apolinar Junes or one of her urology colleagues as needed.  You may also want to consider calling the number provided to establish a primary care provider.    Return to the emergency department if you develop new or worsening symptoms that concern you.

## 2019-06-16 NOTE — ED Notes (Signed)
Pt endorses that he has a ride prior to administration of pain medication.

## 2019-06-22 ENCOUNTER — Emergency Department: Payer: Self-pay

## 2019-06-22 ENCOUNTER — Other Ambulatory Visit: Payer: Self-pay

## 2019-06-22 ENCOUNTER — Emergency Department
Admission: EM | Admit: 2019-06-22 | Discharge: 2019-06-22 | Disposition: A | Discharge status: Home / Self Care | Payer: Self-pay | Attending: Emergency Medicine | Admitting: Emergency Medicine

## 2019-06-22 ENCOUNTER — Encounter: Payer: Self-pay | Admitting: Intensive Care

## 2019-06-22 DIAGNOSIS — F17228 Nicotine dependence, chewing tobacco, with other nicotine-induced disorders: Secondary | ICD-10-CM | POA: Insufficient documentation

## 2019-06-22 DIAGNOSIS — R1011 Right upper quadrant pain: Secondary | ICD-10-CM | POA: Insufficient documentation

## 2019-06-22 DIAGNOSIS — R109 Unspecified abdominal pain: Secondary | ICD-10-CM

## 2019-06-22 DIAGNOSIS — F17210 Nicotine dependence, cigarettes, uncomplicated: Secondary | ICD-10-CM | POA: Insufficient documentation

## 2019-06-22 LAB — URINALYSIS, COMPLETE (UACMP) WITH MICROSCOPIC
Bacteria, UA: NONE SEEN
Bilirubin Urine: NEGATIVE
Glucose, UA: NEGATIVE mg/dL
Hgb urine dipstick: NEGATIVE
Ketones, ur: 5 mg/dL — AB
Leukocytes,Ua: NEGATIVE
Nitrite: NEGATIVE
Protein, ur: NEGATIVE mg/dL
Specific Gravity, Urine: 1.016 (ref 1.005–1.030)
pH: 5 (ref 5.0–8.0)

## 2019-06-22 LAB — HEPATIC FUNCTION PANEL
ALT: 16 U/L (ref 0–44)
AST: 19 U/L (ref 15–41)
Albumin: 4.2 g/dL (ref 3.5–5.0)
Alkaline Phosphatase: 54 U/L (ref 38–126)
Bilirubin, Direct: <0.1 mg/dL (ref 0.0–0.2)
Total Bilirubin: 0.7 mg/dL (ref 0.3–1.2)
Total Protein: 7.1 g/dL (ref 6.5–8.1)

## 2019-06-22 LAB — BASIC METABOLIC PANEL
Anion gap: 9 (ref 5–15)
BUN: 16 mg/dL (ref 6–20)
CO2: 27 mmol/L (ref 22–32)
Calcium: 9.8 mg/dL (ref 8.9–10.3)
Chloride: 102 mmol/L (ref 98–111)
Creatinine, Ser: 0.84 mg/dL (ref 0.61–1.24)
GFR calc Af Amer: >60 mL/min (ref >60)
GFR calc non Af Amer: >60 mL/min (ref >60)
Glucose, Bld: 96 mg/dL (ref 70–99)
Potassium: 4.1 mmol/L (ref 3.5–5.1)
Sodium: 138 mmol/L (ref 135–145)

## 2019-06-22 LAB — CBC
HCT: 42 % (ref 39.0–52.0)
Hemoglobin: 14.5 g/dL (ref 13.0–17.0)
MCH: 31.3 pg (ref 26.0–34.0)
MCHC: 34.5 g/dL (ref 30.0–36.0)
MCV: 90.5 fL (ref 80.0–100.0)
Platelets: 155 10*3/uL (ref 150–400)
RBC: 4.64 MIL/uL (ref 4.22–5.81)
RDW: 12.5 % (ref 11.5–15.5)
WBC: 10.3 10*3/uL (ref 4.0–10.5)
nRBC: 0 % (ref 0.0–0.2)

## 2019-06-22 LAB — TROPONIN I (HIGH SENSITIVITY)
Troponin I (High Sensitivity): 2 ng/L (ref <18)
Troponin I (High Sensitivity): 3 ng/L (ref <18)

## 2019-06-22 LAB — LIPASE, BLOOD: Lipase: 22 U/L (ref 11–51)

## 2019-06-22 MED ORDER — METHOCARBAMOL 500 MG PO TABS: 500.0000 mg | ORAL_TABLET | Freq: Three times a day (TID) | ORAL | 0 refills | Status: AC | PRN

## 2019-06-22 MED ORDER — METHOCARBAMOL 500 MG PO TABS
1000.0000 mg | ORAL_TABLET | Freq: Once | ORAL | Status: AC
  Administered 2019-06-22: 1000 mg via ORAL
  Filled 2019-06-22: qty 2

## 2019-06-22 MED ORDER — KETOROLAC TROMETHAMINE 30 MG/ML IJ SOLN
30.0000 mg | Freq: Once | INTRAMUSCULAR | Status: AC
  Administered 2019-06-22: 17:00:00 30 mg via INTRAMUSCULAR
  Filled 2019-06-22: qty 1

## 2019-06-22 MED ORDER — KETOROLAC TROMETHAMINE 10 MG PO TABS: 10.0000 mg | ORAL_TABLET | Freq: Four times a day (QID) | ORAL | 0 refills | Status: AC | PRN

## 2019-06-22 NOTE — ED Provider Notes (Signed)
Emergency Department Provider Note  ____________________________________________  Time seen: Approximately 4:01 PM  I have reviewed the triage vital signs and the nursing notes.   HISTORY  Chief Complaint Chest Pain   Historian Patient    HPI Logan Sellers is a 34 y.o. male with a history of nephrolithiasis, presents to the emergency department with right upper quadrant abdominal pain.  Patient states the pain seems to radiate into his right upper chest and sometimes along the right flank.  Patient states that he had a ureteral stone diagnosed on the left on 06/16/2019.  He reports that his dysuria and increased urinary frequency have resolved with antibiotics.  He denies associated diarrhea or emesis.  He reports that his right upper quadrant abdominal pain is worse in a supine position and relieved with sitting.  He denies chest tightness or shortness of breath.  No cough.  Patient had prior pneumothorax and is concern for possible new pneumothorax.  He has been afebrile at home.  No falls or mechanisms of trauma.  No other alleviating measures have been attempted.   Past Medical History:  Diagnosis Date  . History of kidney stones   . Pneumonia   . Pneumothorax   . URI (upper respiratory infection)      Immunizations up to date:  Yes.     Past Medical History:  Diagnosis Date  . History of kidney stones   . Pneumonia   . Pneumothorax   . URI (upper respiratory infection)     There are no problems to display for this patient.   Past Surgical History:  Procedure Laterality Date  . CHEST TUBE INSERTION    . RHINOPLASTY      Prior to Admission medications   Medication Sig Start Date End Date Taking? Authorizing Provider  azithromycin (ZITHROMAX Z-PAK) 250 MG tablet Take 2 tablets (500 mg) on  Day 1,  followed by 1 tablet (250 mg) once daily on Days 2 through 5. 04/28/15   Duanne Guess, PA-C  doxycycline (VIBRAMYCIN) 100 MG capsule Take 1 capsule (100 mg total)  by mouth 2 (two) times daily for 14 days. 06/16/19 06/30/19  Hinda Kehr, MD  HYDROcodone-acetaminophen (NORCO/VICODIN) 5-325 MG tablet Take 2 tablets by mouth every 6 (six) hours as needed for moderate pain or severe pain. 06/16/19   Hinda Kehr, MD  ondansetron (ZOFRAN ODT) 4 MG disintegrating tablet Allow 1-2 tablets to dissolve in your mouth every 8 hours as needed for nausea/vomiting 06/16/19   Hinda Kehr, MD  predniSONE (DELTASONE) 10 MG tablet Take 1 tablet (10 mg total) by mouth daily. 6,5,4,3,2,1 six day taper 04/28/15   Duanne Guess, PA-C  tamsulosin [facility]) 0.4 MG CAPS capsule Take 1 capsule (0.4 mg total) by mouth daily after breakfast. 12/30/17   Gregor Hams, MD    Allergies Patient has no known allergies.  History reviewed. No pertinent family history.  Social History Social History   Tobacco Use  . Smoking status: Current Some Day Smoker    Types: Cigarettes  . Smokeless tobacco: Current User    Types: Snuff  Substance Use Topics  . Alcohol use: No  . Drug use: No     Review of Systems  Constitutional: No fever/chills Eyes:  No discharge ENT: No upper respiratory complaints. Respiratory: no cough. No SOB/ use of accessory muscles to breath Gastrointestinal: Patient has right upper quadrant abdominal pain.  Musculoskeletal: Negative for musculoskeletal pain. Skin: Negative for rash, abrasions, lacerations, ecchymosis.   ____________________________________________  PHYSICAL EXAM:  VITAL SIGNS: ED Triage Vitals  Enc Vitals Group     BP 06/22/19 1245 (!) 145/89     Pulse Rate 06/22/19 1245 65     Resp 06/22/19 1245 18     Temp 06/22/19 1245 97.9 F (36.6 C)     Temp Source 06/22/19 1245 Oral     SpO2 06/22/19 1245 99 %     Weight 06/22/19 1251 130 lb (59 kg)     Height 06/22/19 1251 5\' 9"  (1.753 m)     Head Circumference --      Peak Flow --      Pain Score 06/22/19 1251 6     Pain Loc --      Pain Edu? --      Excl. in GC? --       Constitutional: Alert and oriented. Well appearing and in no acute distress. Eyes: Conjunctivae are normal. PERRL. EOMI. Head: Atraumatic. ENT: Cardiovascular: Normal rate, regular rhythm. Normal S1 and S2.  Good peripheral circulation. Respiratory: Normal respiratory effort without tachypnea or retractions. Lungs CTAB. Good air entry to the bases with no decreased or absent breath sounds Gastrointestinal: Bowel sounds x 4 quadrants.  Patient has right upper quadrant abdominal tenderness to palpation.  No guarding.  No CVA tenderness.  No guarding or rigidity. No distention. Musculoskeletal: Full range of motion to all extremities. No obvious deformities noted Neurologic:  Normal for age. No gross focal neurologic deficits are appreciated.  Skin:  Skin is warm, dry and intact. No rash noted. Psychiatric: Mood and affect are normal for age. Speech and behavior are normal.   ____________________________________________   LABS (all labs ordered are listed, but only abnormal results are displayed)  Labs Reviewed  BASIC METABOLIC PANEL  CBC  HEPATIC FUNCTION PANEL  LIPASE, BLOOD  URINALYSIS, COMPLETE (UACMP) WITH MICROSCOPIC  TROPONIN I (HIGH SENSITIVITY)  TROPONIN I (HIGH SENSITIVITY)   ____________________________________________  EKG   ____________________________________________  RADIOLOGY 06/24/19, personally viewed and evaluated these images (plain radiographs) as part of my medical decision making, as well as reviewing the written report by the radiologist.  DG Chest 2 View  Result Date: 06/22/2019 CLINICAL DATA:  Patient c/o right rib/side pain starting yesterday. Patient states having a hx of a pneumothorax, kidney stones, pna. EXAM: CHEST - 2 VIEW COMPARISON:  Chest radiograph 04/28/2015 FINDINGS: The heart size and mediastinal contours are within normal limits. The lungs are clear. No pneumothorax or pleural effusion. The visualized skeletal structures are  unremarkable. IMPRESSION: No acute cardiopulmonary finding. Electronically Signed   By: 04/30/2015 M.D.   On: 06/22/2019 14:30    ____________________________________________    PROCEDURES  Procedure(s) performed:     Procedures     Medications - No data to display   ____________________________________________   INITIAL IMPRESSION / ASSESSMENT AND PLAN / ED COURSE  Pertinent labs & imaging results that were available during my care of the patient were reviewed by me and considered in my medical decision making (see chart for details).      Assessment and Plan: Abdominal pain 34 year old male presents to the emergency department with right upper quadrant abdominal pain.  Vital signs are reassuring at triage.  On physical exam, patient had right upper quadrant tenderness to palpation without guarding.  Differential diagnosis includes cholecystitis, cholelithiasis, abdominal wall strain, pancreatitis, pyelonephritis...  CBC and CMP were reassuring.  Hepatic function was noted within the parameters of normal.  Right upper quadrant ultrasound was  noncontributory for cholecystitis or cholelithiasis.  Both sets of troponin were within reference range.  No ischemic changes on EKG.  Urinalysis was noncontributory for cystitis, decreasing suspicion for pyelonephritis.  Lipase was within reference range, decreasing suspicion for pancreatitis.  Patient reported significant improvement in his pain after Toradol and Robaxin.  He was discharged with Toradol and Robaxin.  Return precautions were given to return with new or worsening symptoms.  All patient questions were answered    ____________________________________________  FINAL CLINICAL IMPRESSION(S) / ED DIAGNOSES  Final diagnoses:  Abdominal pain      NEW MEDICATIONS STARTED DURING THIS VISIT:  ED Discharge Orders    None          This chart was dictated using voice recognition software/Dragon. Despite  best efforts to proofread, errors can occur which can change the meaning. Any change was purely unintentional.     Gasper Lloyd 06/22/19 Genoveva Ill, MD 06/22/19 2045

## 2019-06-22 NOTE — ED Triage Notes (Signed)
Patient c/o right shoulder/rib cage pain that started yesterday afternoon. HX collapsed lung X7 years ago

## 2019-06-22 NOTE — ED Notes (Signed)
First Nurse Note: Pt c/o pain in his lower right rib cage. Pt is in NAD at this time.   Hx/o right pneumothorax

## 2019-06-22 NOTE — ED Notes (Signed)
See triage note. Pt reports right sided pain under ribs, states he passed a kidney stone over the weekend.

## 2021-09-12 IMAGING — CT CT RENAL STONE PROTOCOL
1 of 2 series · 2 of 36 positions shown, 3 images · non-contrast
Comparison: Scrotal ultrasound earlier today. CT Abdomen and Pelvis
12/30/2017.

CLINICAL DATA: 33-year-old male with left flank and testicular pain
since 0955 hours yesterday.

EXAM:
CT ABDOMEN AND PELVIS WITHOUT CONTRAST
TECHNIQUE: Multidetector CT imaging of the abdomen and pelvis was performed
following the standard protocol without IV contrast.

[Series 6: sagittal · sagittal · 0.45mm/px · 2 of 151 slices shown, 3 images]
[im 51/151  soft-tissue]
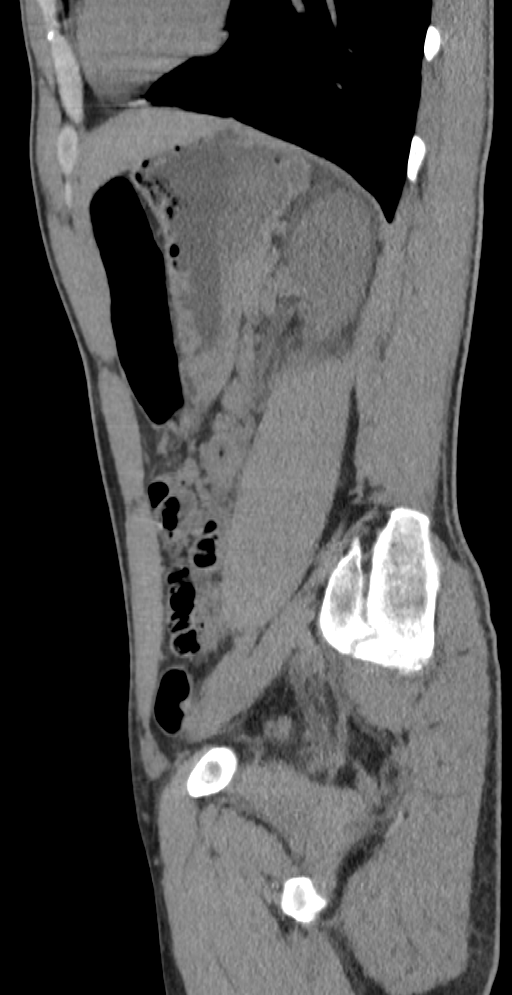
[im 51/151  bone]
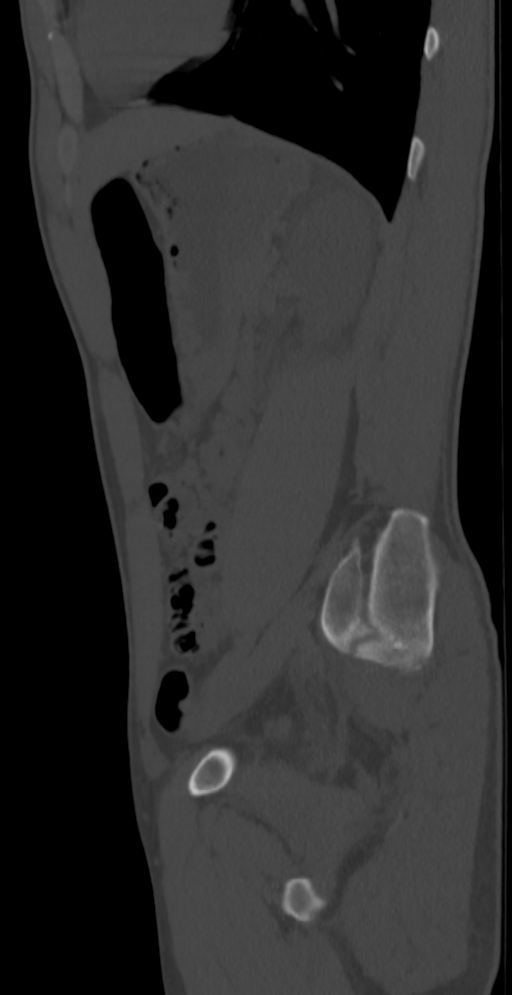
[im 101/151  soft-tissue]
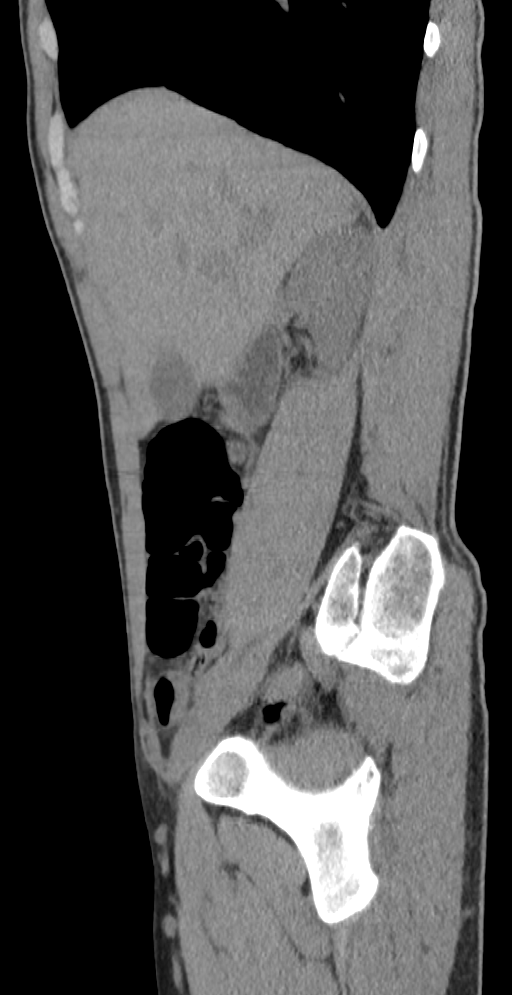

[2 of 36 positions shown; findings below may reference images not displayed]

FINDINGS: Lower chest: Negative.

Hepatobiliary: Stable and negative noncontrast liver and
gallbladder.

Pancreas: Negative.

Spleen: Negative.

Adrenals/Urinary Tract: Normal adrenal glands.

Punctate right nephrolithiasis but otherwise normal noncontrast
right kidney. Proximal right ureter appears decompressed.

Asymmetric left renal enlargement and hydronephrosis with proximal
hydroureter. The ureter appears to remain asymmetrically dilated
into the pelvis (series 2, image 50), and there is a 3-4 mm calculus
just proximal to the left UVJ on series 2, image 70. This is a
different stone than was demonstrated in 0655. Contralateral right
hemipelvis phlebolith is incidentally hree identified. The urinary
bladder is decompressed and unremarkable. Possible punctate
additional intrarenal calculi on the left.

Stomach/Bowel: No dilated large or small bowel. Intermittent gas
within the colon. Redundant transverse and sigmoid colon segments.
Evidence of a normal appendix on coronal image 43. Unremarkable
stomach. No free air. No free fluid identified.

Vascular/Lymphatic: Vascular patency is not evaluated in the absence
of IV contrast. No lymphadenopathy is evident.

Reproductive: Negative noncontrast appearance.

Other: No pelvic free fluid.

Musculoskeletal: Negative.
IMPRESSION: 1. Acute obstructive uropathy on the left with a 3-4 mm distal
ureteral calculus just proximal to the left UVJ.
2. Punctate nephrolithiasis, greater on the right.

## 2021-09-18 IMAGING — CR DG CHEST 2V
1 series · 3 of 3 positions shown · non-contrast
Comparison: Chest radiograph 04/28/2015

CLINICAL DATA: Patient c/o right rib/side pain starting yesterday.
Patient states having a hx of a pneumothorax, kidney stones, pna.

EXAM:
CHEST - 2 VIEW

[Series 1: dg chest 2 view · 0.14mm/px · 3 of 3 slices shown]
[im 1/3]
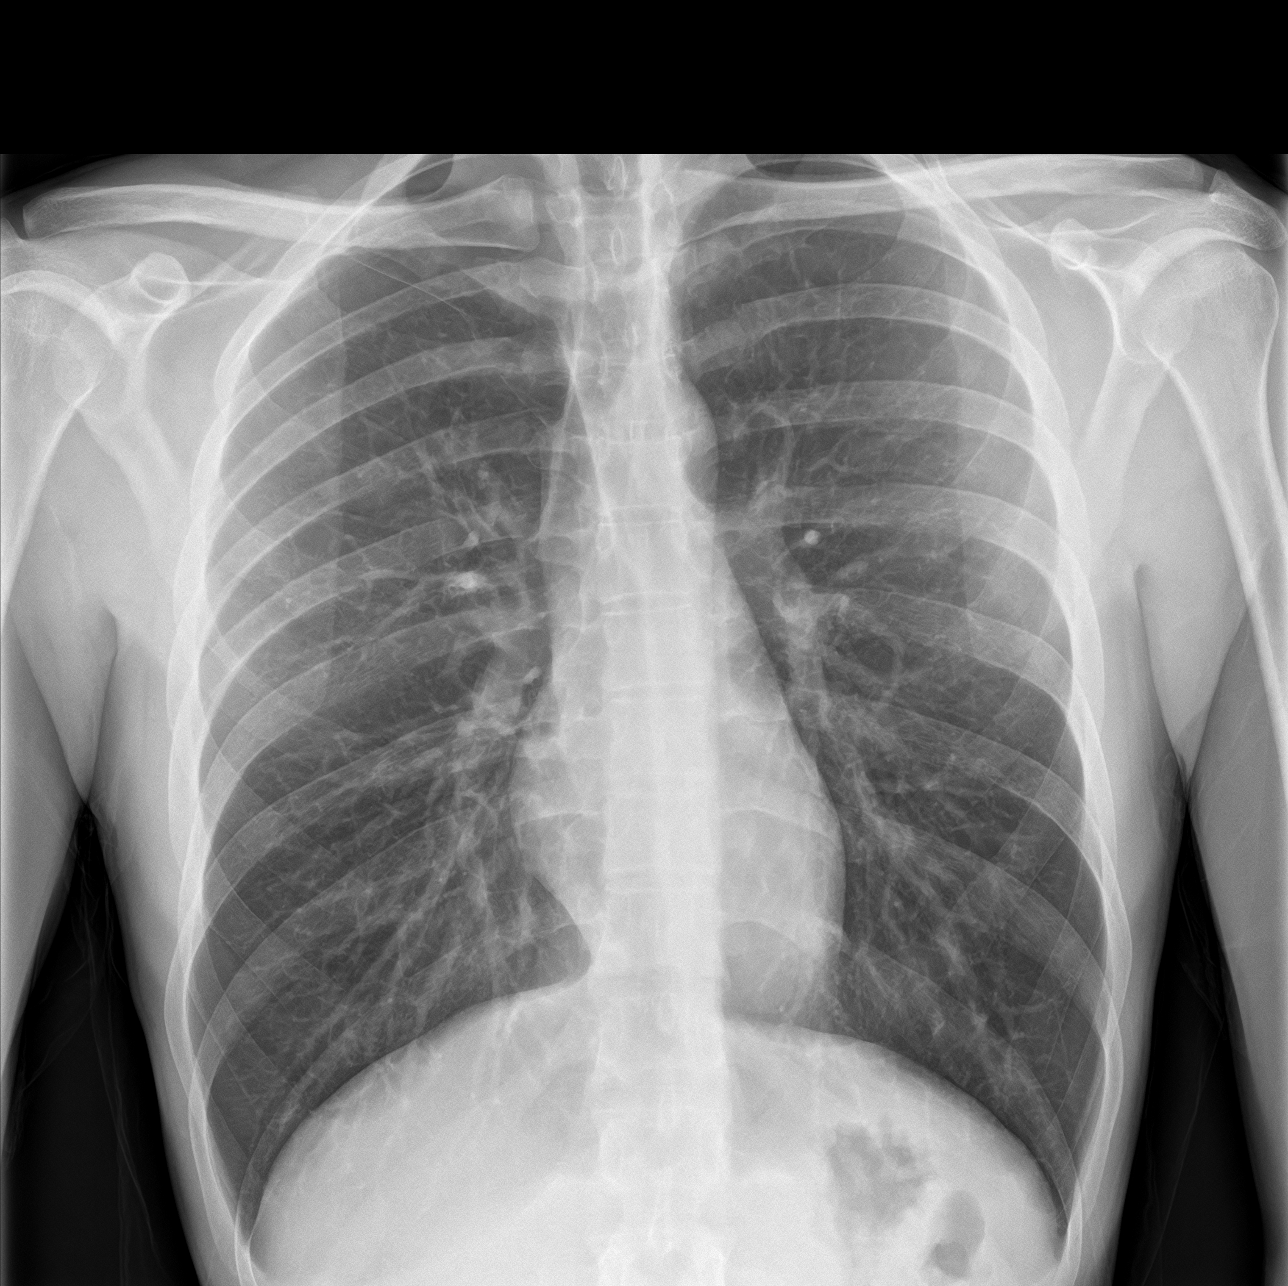
[im 2/3]
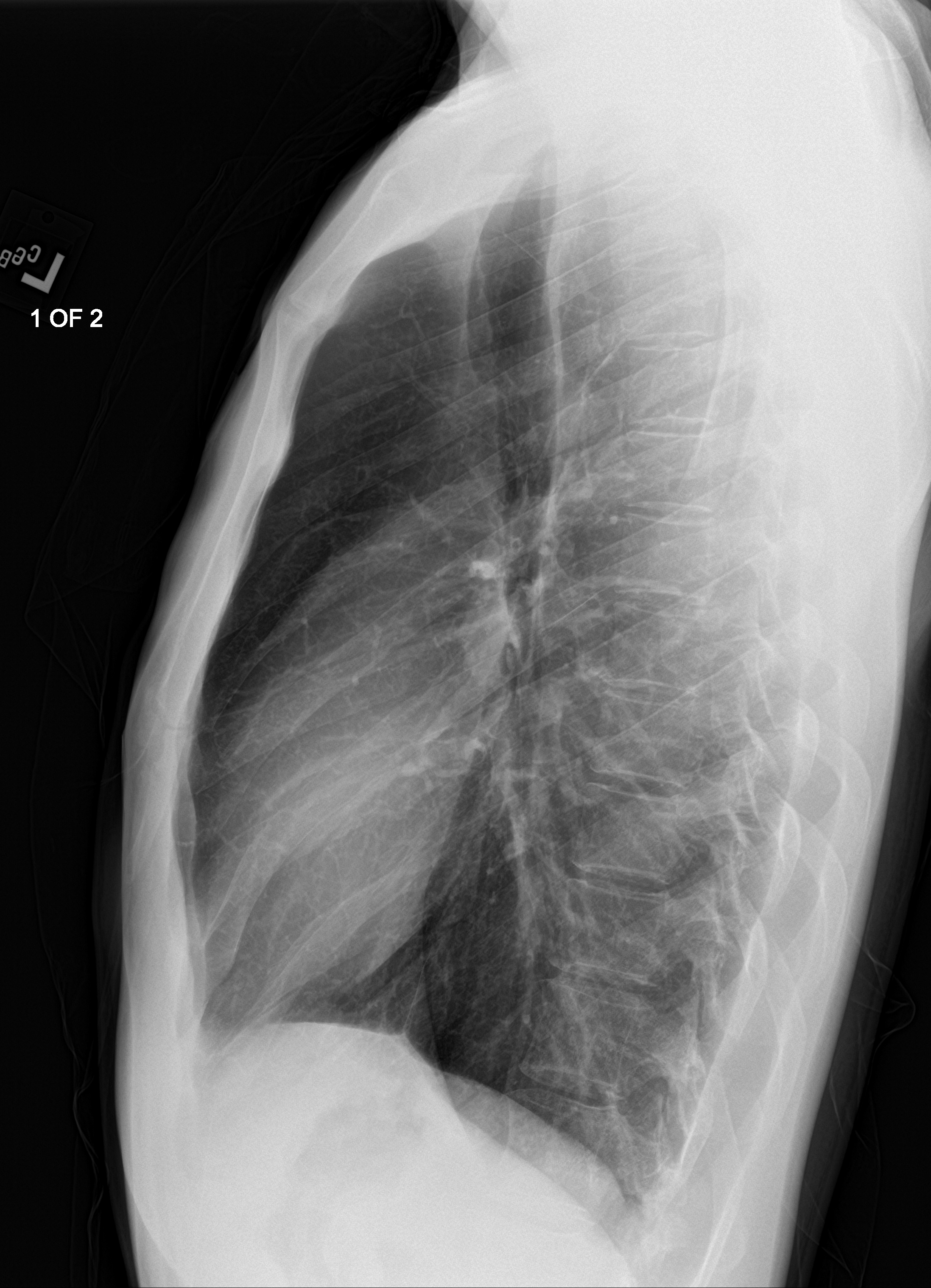
[im 3/3]
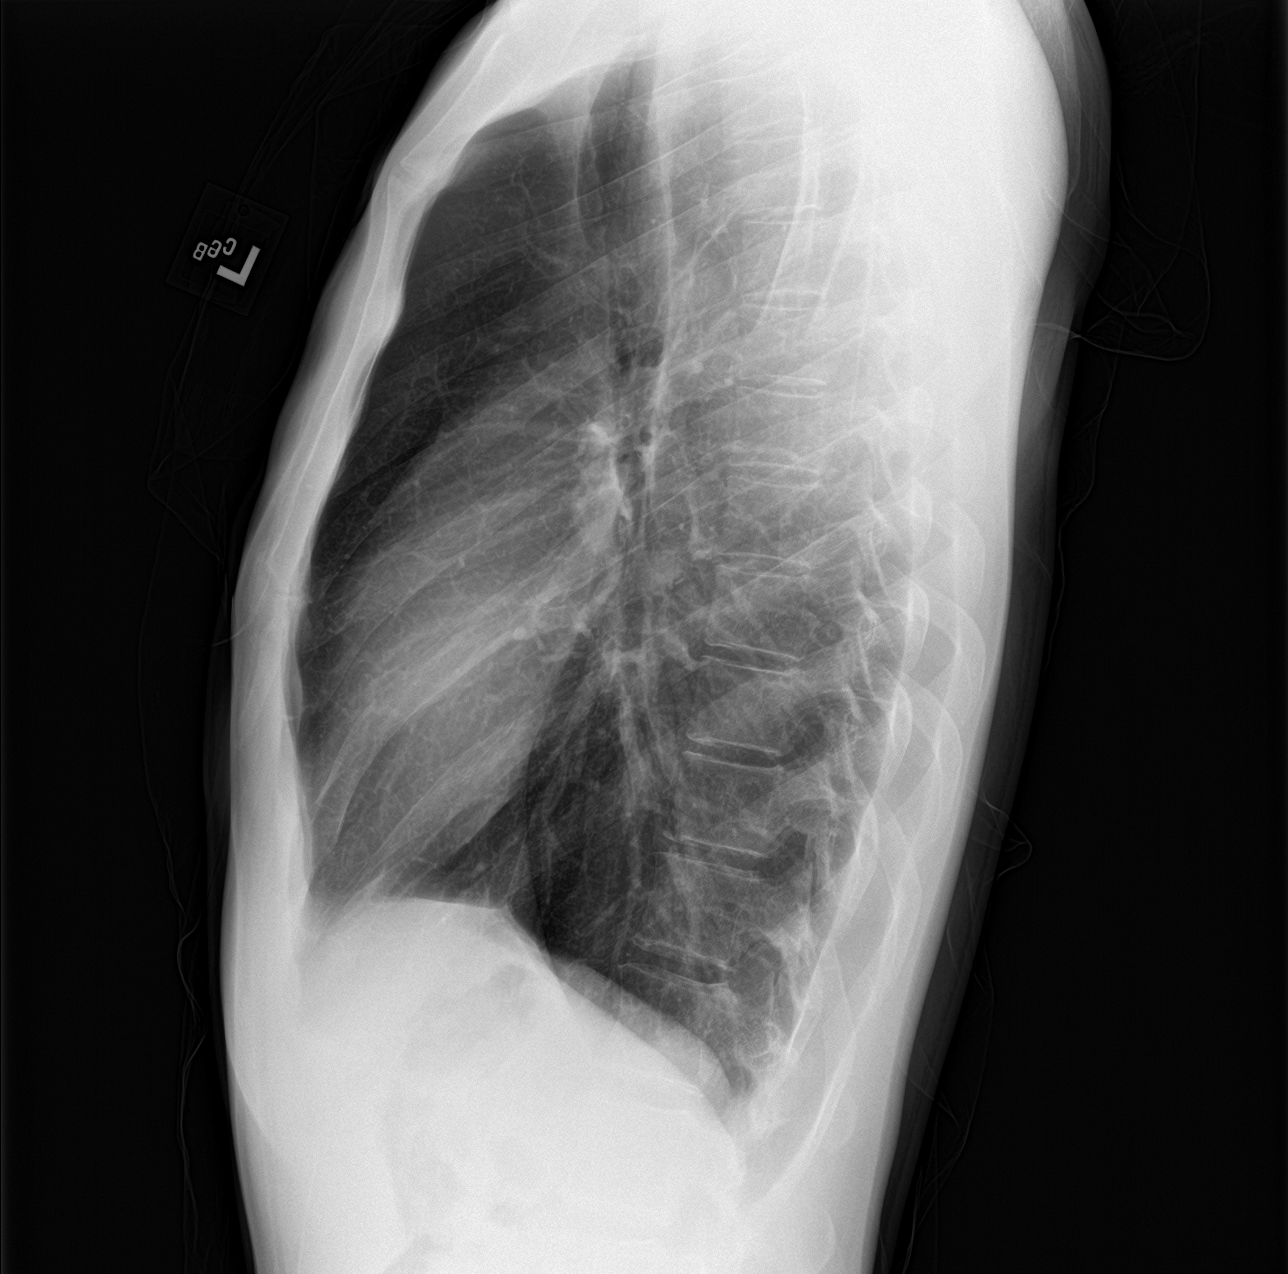

[3 of 3 positions shown; findings below may reference images not displayed]

FINDINGS: The heart size and mediastinal contours are within normal limits.
The lungs are clear. No pneumothorax or pleural effusion. The
visualized skeletal structures are unremarkable.
IMPRESSION: No acute cardiopulmonary finding.

## 2021-09-18 IMAGING — US US ABDOMEN LIMITED
1 series · 14 of 25 positions shown · non-contrast
Comparison: None.

CLINICAL DATA: Abdomen pain for 1 day

EXAM:
ULTRASOUND ABDOMEN LIMITED RIGHT UPPER QUADRANT

[Series 1: us abdomen limited · 14 of 51 slices shown]
[im 1/51]
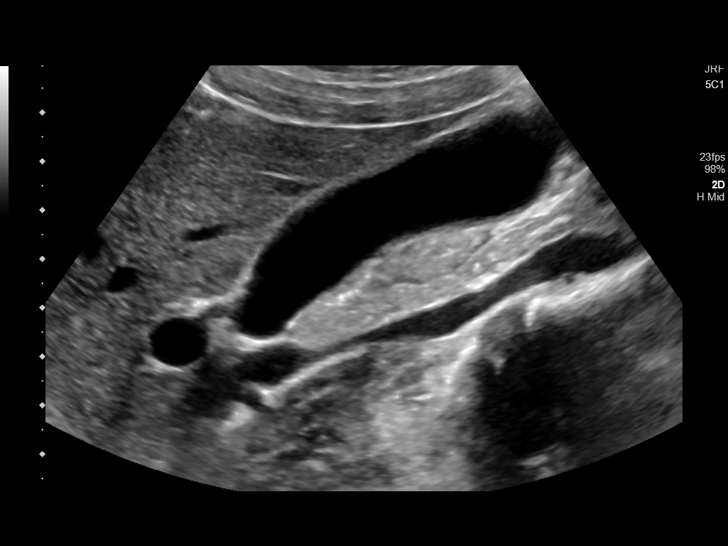
[im 5/51]
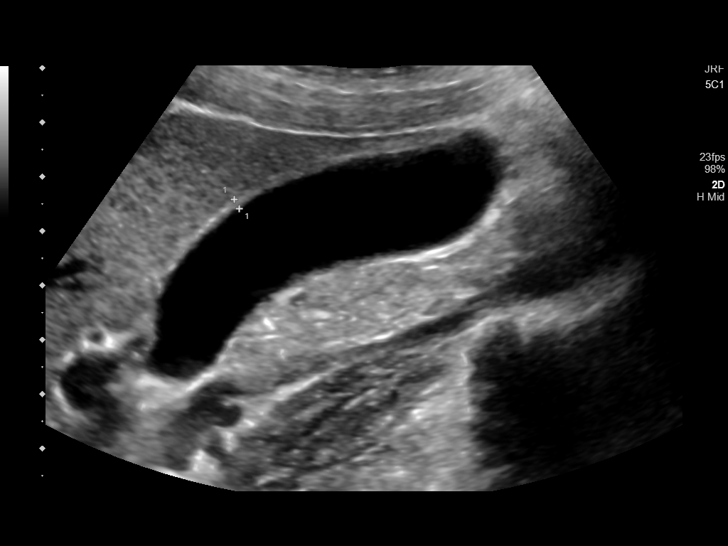
[im 9/51]
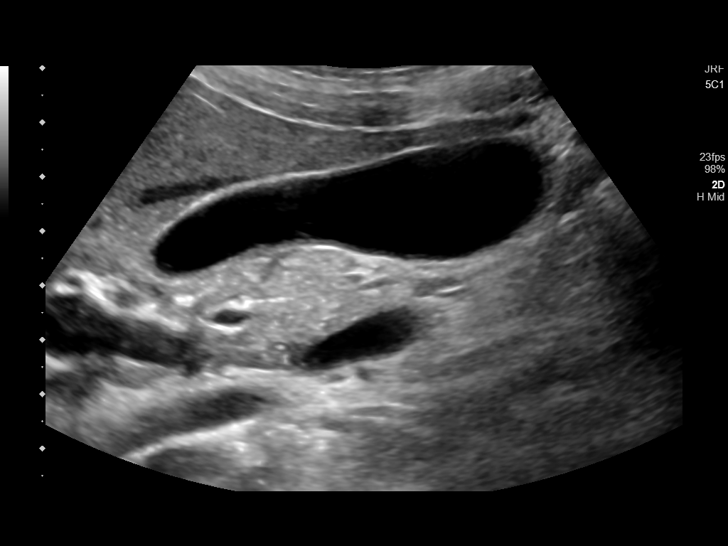
[im 13/51]
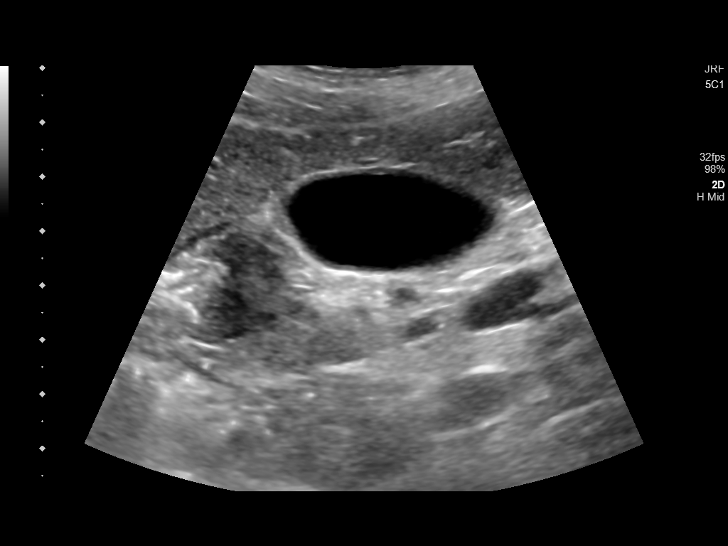
[im 17/51]
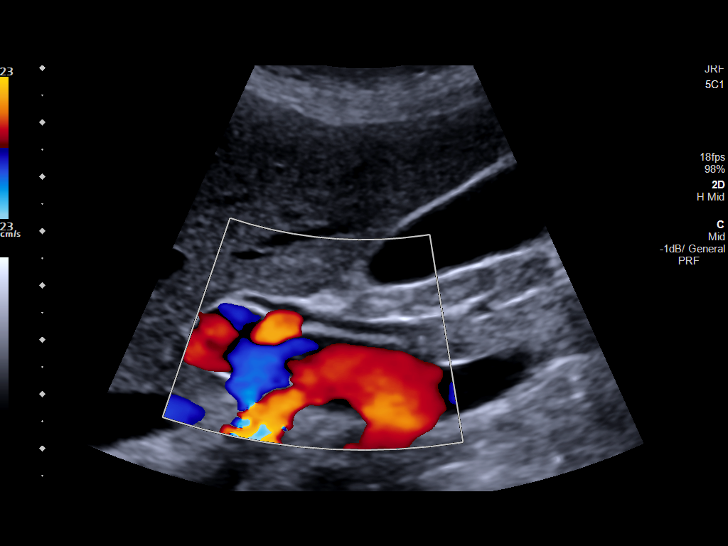
[im 19/51]
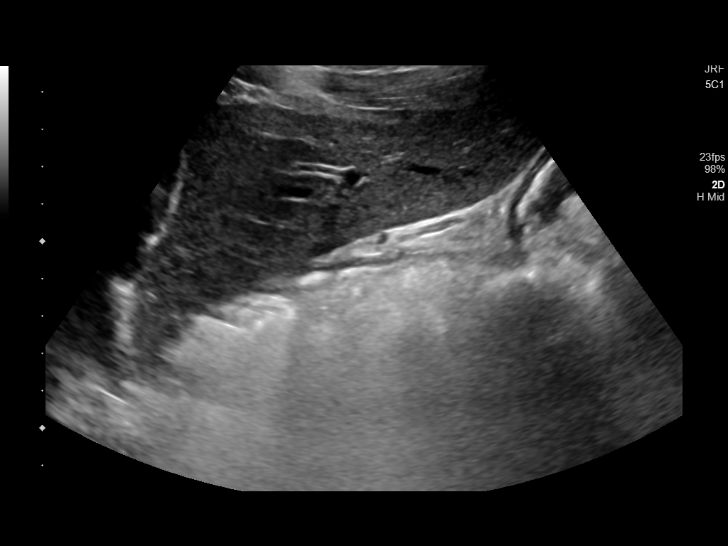
[im 23/51]
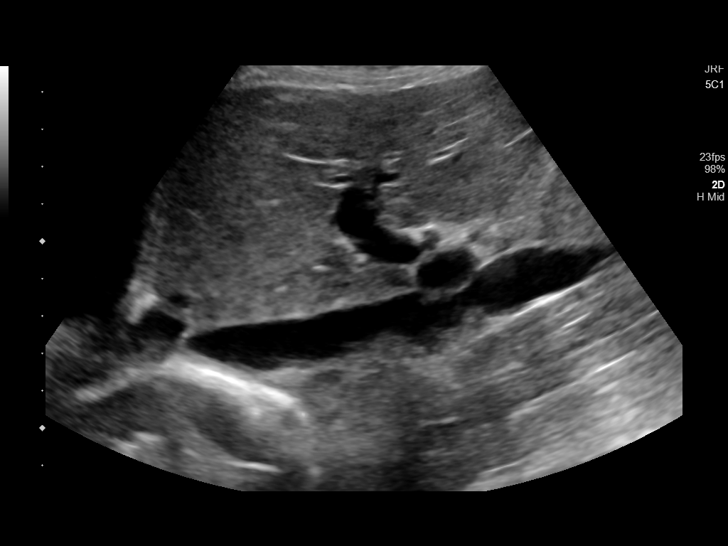
[im 28/51]
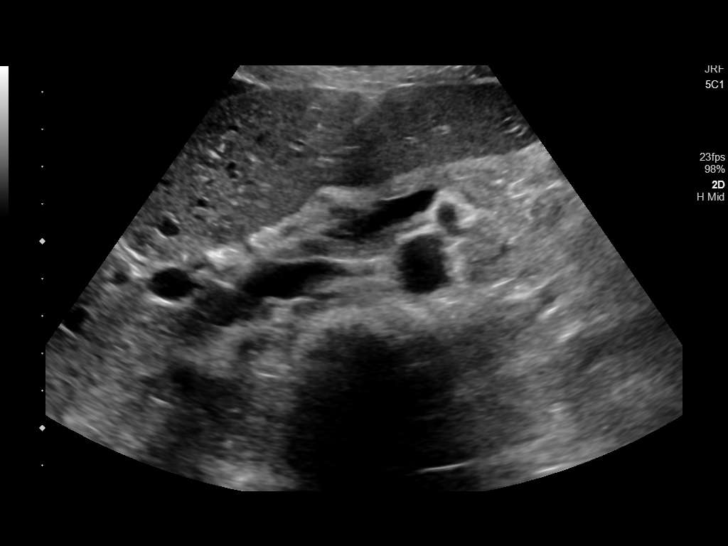
[im 32/51]
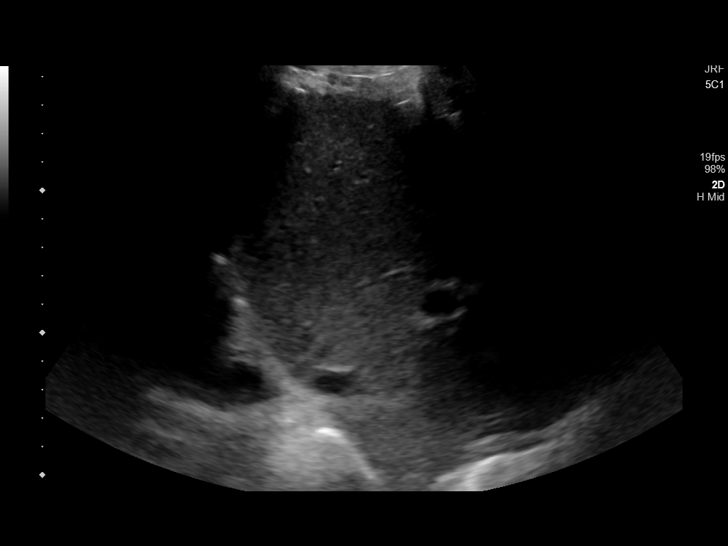
[im 34/51]
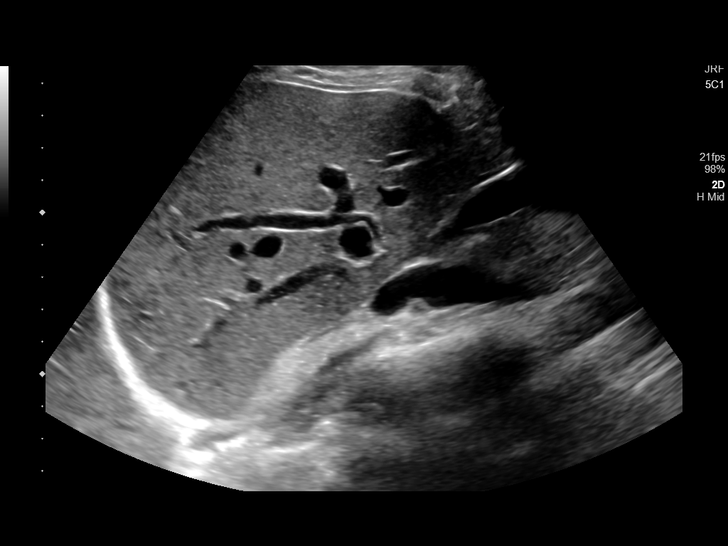
[im 38/51]
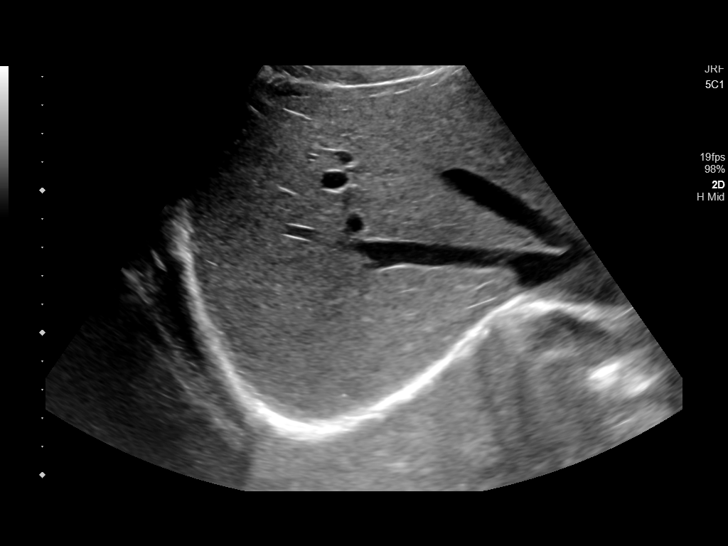
[im 42/51]
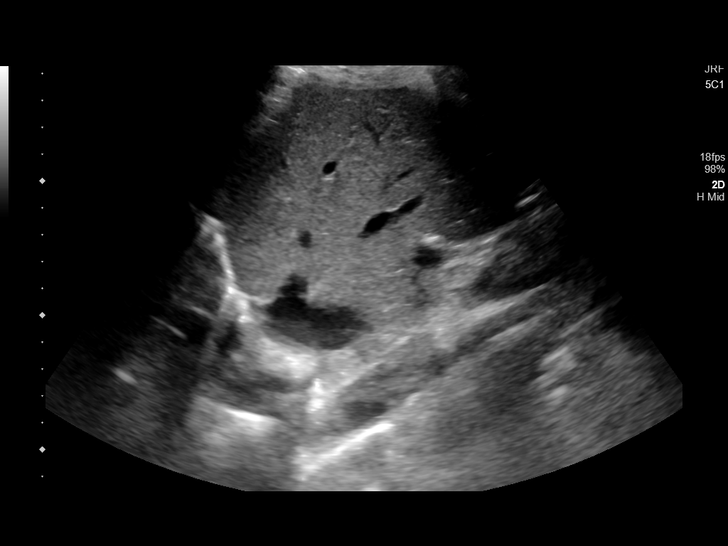
[im 46/51]
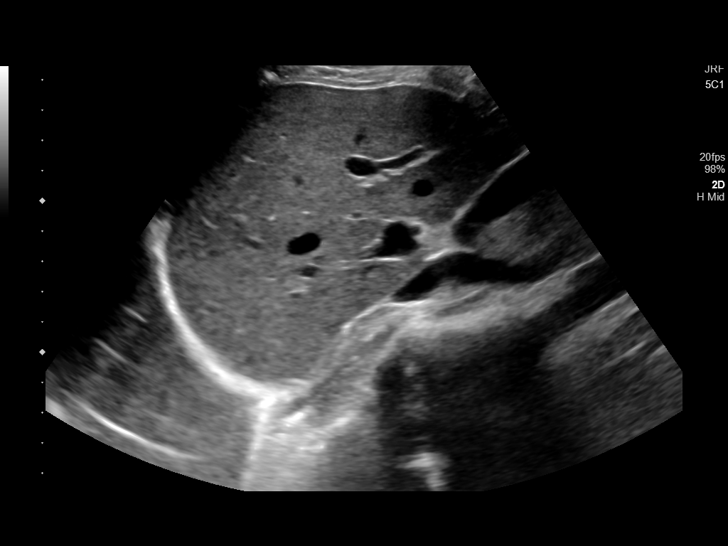
[im 51/51]
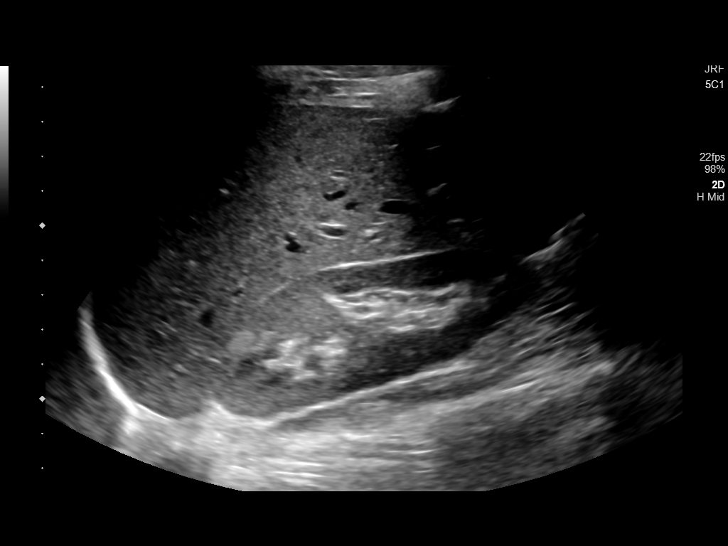

[14 of 25 positions shown; findings below may reference images not displayed]

FINDINGS: Gallbladder:

No gallstones or wall thickening visualized. No sonographic Murphy
sign noted by sonographer.

Common bile duct:

Diameter: 1.8 mm

Liver:

No focal lesion identified. Within normal limits in parenchymal
echogenicity. Portal vein is patent on color Doppler imaging with
normal direction of blood flow towards the liver.

Other: None.
IMPRESSION: Normal gallbladder. No acute abnormality noted in the right upper
quadrant.

## 2022-12-25 ENCOUNTER — Other Ambulatory Visit: Payer: Self-pay

## 2022-12-25 ENCOUNTER — Emergency Department
Admission: EM | Admit: 2022-12-25 | Discharge: 2022-12-25 | Disposition: A | Discharge status: Home / Self Care | Payer: Self-pay | Attending: Emergency Medicine | Admitting: Emergency Medicine

## 2022-12-25 DIAGNOSIS — R5381 Other malaise: Secondary | ICD-10-CM | POA: Insufficient documentation

## 2022-12-25 DIAGNOSIS — Z20822 Contact with and (suspected) exposure to covid-19: Secondary | ICD-10-CM | POA: Insufficient documentation

## 2022-12-25 DIAGNOSIS — R531 Weakness: Secondary | ICD-10-CM | POA: Insufficient documentation

## 2022-12-25 DIAGNOSIS — R5383 Other fatigue: Secondary | ICD-10-CM | POA: Insufficient documentation

## 2022-12-25 LAB — BASIC METABOLIC PANEL
Anion gap: 10 (ref 5–15)
BUN: 17 mg/dL (ref 6–20)
CO2: 28 mmol/L (ref 22–32)
Calcium: 9.8 mg/dL (ref 8.9–10.3)
Chloride: 102 mmol/L (ref 98–111)
Creatinine, Ser: 0.87 mg/dL (ref 0.61–1.24)
GFR, Estimated: >60 mL/min (ref >60)
Glucose, Bld: 108 mg/dL — ABNORMAL HIGH (ref 70–99)
Potassium: 4.3 mmol/L (ref 3.5–5.1)
Sodium: 140 mmol/L (ref 135–145)

## 2022-12-25 LAB — RESP PANEL BY RT-PCR (RSV, FLU A&B, COVID)  RVPGX2
Influenza A by PCR: NEGATIVE
Influenza B by PCR: NEGATIVE
Resp Syncytial Virus by PCR: NEGATIVE
SARS Coronavirus 2 by RT PCR: NEGATIVE

## 2022-12-25 LAB — CBC
HCT: 43.1 % (ref 39.0–52.0)
Hemoglobin: 15 g/dL (ref 13.0–17.0)
MCH: 31.6 pg (ref 26.0–34.0)
MCHC: 34.8 g/dL (ref 30.0–36.0)
MCV: 90.9 fL (ref 80.0–100.0)
Platelets: 143 10*3/uL — ABNORMAL LOW (ref 150–400)
RBC: 4.74 MIL/uL (ref 4.22–5.81)
RDW: 12.6 % (ref 11.5–15.5)
WBC: 9.7 10*3/uL (ref 4.0–10.5)
nRBC: 0 % (ref 0.0–0.2)

## 2022-12-25 LAB — TROPONIN I (HIGH SENSITIVITY): Troponin I (High Sensitivity): 3 ng/L (ref <18)

## 2022-12-25 NOTE — ED Provider Notes (Signed)
[facility] Emergency Department Provider Note     Event Date/Time   First MD Initiated Contact with Patient 12/25/22 1652     (approximate)   History   Weakness   HPI  Zuriel Ohr is a 37 y.o. male with a history of kidney stones and spontaneous pneumothorax, presents to the ED for evaluation of generalized weakness and malaise.  Patient reports 2 days of symptoms.  Denies any fevers, chills, sweats, chest pain, or shortness of breath.  He would endorse an episode yesterday where he had some blurry vision in one of his eyes.  Patient describes some insomnia, and poor appetite.  He would relate this to the fact that he gets so caught up in his daily activity that sometimes he forgets to eat.  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  They live together along with his girlfriend.  Patient denies any recent travel, bad food exposure, or sick contacts.   Physical Exam   Triage Vital Signs: ED Triage Vitals  Encounter Vitals Group     BP 12/25/22 1548 (!) 137/98     Systolic BP Percentile --      Diastolic BP Percentile --      Pulse Rate 12/25/22 1548 79     Resp 12/25/22 1548 17     Temp 12/25/22 1548 98.6 F (37 C)     Temp Source 12/25/22 1548 Oral     SpO2 12/25/22 1548 98 %     Weight 12/25/22 1549 125 lb (56.7 kg)     Height 12/25/22 1549 5\' 9"  (1.753 m)     Head Circumference --      Peak Flow --      Pain Score 12/25/22 1548 0     Pain Loc --      Pain Education --      Exclude from Growth Chart --     Most recent vital signs: Vitals:   12/25/22 1548  BP: (!) 137/98  Pulse: 79  Resp: 17  Temp: 98.6 F (37 C)  SpO2: 98%    General Awake, no distress.  NAD HEENT NCAT. PERRL. EOMI. No rhinorrhea. Mucous membranes are moist.  CV:  Good peripheral perfusion.  RRR RESP:  Normal effort.  CTA ABD:  No distention.  Nontender.  No rebound, guarding, or rigidity  noted. MSK:  Normal active range of motion in all extremities.  Normal gait without ataxia.   ED Results / Procedures / Treatments   Labs (all labs ordered are listed, but only abnormal results are displayed) Labs Reviewed  BASIC METABOLIC PANEL - Abnormal; Notable for the following components:      Result Value   Glucose, Bld 108 (*)    All other components within normal limits  CBC - Abnormal; Notable for the following components:   Platelets 143 (*)    All other components within normal limits  RESP PANEL BY RT-PCR (RSV, FLU A&B, COVID)  RVPGX2  TROPONIN I (HIGH SENSITIVITY)     EKG  Vent. rate 77 BPM PR interval 174 ms QRS duration 104 ms QT/QTcB 348/393 ms P-R-T axes 68 84 75 Normal sinus rhythm with sinus arrhythmia Incomplete right bundle branch block Borderline ECG When compared with ECG of 13-  RADIOLOGY  No results found.   PROCEDURES:  Critical Care performed: No  Procedures   MEDICATIONS ORDERED IN ED: Medications - No data to  display   IMPRESSION / MDM / ASSESSMENT AND PLAN / ED COURSE  I reviewed the triage vital signs and the nursing notes.                              Differential diagnosis includes, but is not limited to, COVID, flu, RSV, electrolyte abnormality, leukocytosis, viral URI, EKG abnormality, stress response  Patient's presentation is most consistent with acute complicated illness / injury requiring diagnostic workup.  Patient's diagnosis is consistent with fatigue of unclear etiology.  Patient may also be experiencing some stress response according to his report.  Patient on exam and workup overall with no signs of any acute lab abnormalities or malignant arrhythmias.  Patient is to follow up with a local community clinic for primary care home, as needed or otherwise directed. Patient is given ED precautions to return to the ED for any worsening or new symptoms.  FINAL CLINICAL IMPRESSION(S) / ED DIAGNOSES   Final diagnoses:   Fatigue, unspecified type     Rx / DC Orders   ED Discharge Orders     None        Note:  This document was prepared using Dragon voice recognition software and may include unintentional dictation errors.    Lissa Hoard, PA-C 12/25/22 1731    Janith Lima, MD 12/26/22 [phone]

## 2022-12-25 NOTE — Discharge Instructions (Addendum)
Your exam, labs, and EKG are normal and reassuring at this time.  No signs of a serious underlying abnormalities.  Your viral panel test was also negative.  Your symptoms may be due to to potential viral infection versus stress response.  Follow-up with one of the local community clinics for ongoing routine medical care.  Return to the ED if needed.

## 2022-12-25 NOTE — ED Triage Notes (Signed)
Pt sts that he has been weak and not feeling well. Pt sts that he has been having excessive sweating and loss of vision.

## 2022-12-29 ENCOUNTER — Telehealth: Payer: Self-pay

## 2022-12-29 NOTE — Telephone Encounter (Signed)
Called to give info on [facility]. No answer, left msg.

## 2023-03-28 ENCOUNTER — Encounter: Payer: Self-pay | Admitting: Emergency Medicine

## 2023-03-28 ENCOUNTER — Emergency Department
Admission: EM | Admit: 2023-03-28 | Discharge: 2023-03-28 | Discharge status: Against Medical Advice | Payer: Self-pay | Attending: Emergency Medicine | Admitting: Emergency Medicine

## 2023-03-28 ENCOUNTER — Other Ambulatory Visit: Payer: Self-pay

## 2023-03-28 DIAGNOSIS — R109 Unspecified abdominal pain: Secondary | ICD-10-CM | POA: Insufficient documentation

## 2023-03-28 DIAGNOSIS — Z5321 Procedure and treatment not carried out due to patient leaving prior to being seen by health care provider: Secondary | ICD-10-CM | POA: Insufficient documentation

## 2023-03-28 LAB — BASIC METABOLIC PANEL
Anion gap: 8 (ref 5–15)
BUN: 19 mg/dL (ref 6–20)
CO2: 23 mmol/L (ref 22–32)
Calcium: 9.1 mg/dL (ref 8.9–10.3)
Chloride: 105 mmol/L (ref 98–111)
Creatinine, Ser: 0.78 mg/dL (ref 0.61–1.24)
GFR, Estimated: >60 mL/min (ref >60)
Glucose, Bld: 93 mg/dL (ref 70–99)
Potassium: 3.6 mmol/L (ref 3.5–5.1)
Sodium: 136 mmol/L (ref 135–145)

## 2023-03-28 LAB — CBC
HCT: 39.6 % (ref 39.0–52.0)
Hemoglobin: 13.8 g/dL (ref 13.0–17.0)
MCH: 32.5 pg (ref 26.0–34.0)
MCHC: 34.8 g/dL (ref 30.0–36.0)
MCV: 93.2 fL (ref 80.0–100.0)
Platelets: 118 10*3/uL — ABNORMAL LOW (ref 150–400)
RBC: 4.25 MIL/uL (ref 4.22–5.81)
RDW: 12.3 % (ref 11.5–15.5)
WBC: 10.8 10*3/uL — ABNORMAL HIGH (ref 4.0–10.5)
nRBC: 0 % (ref 0.0–0.2)

## 2023-03-28 NOTE — ED Triage Notes (Signed)
Patient to ED via EMS from work for right sided flank pain. Started today. Denies urinary symptoms. Hx of same.

## 2023-03-28 NOTE — ED Notes (Signed)
Patient stating he no longer wishes to be seen. This RN encouraged patient to stay- he declined. Ambulatory without difficulty. Aox4.
